# Patient Record
Sex: Female | Born: 1990 | Race: White | Hispanic: No | Marital: Married | Smoking: Never smoker
Health system: Southern US, Community
[De-identification: ages and names within clinical notes are randomized; demographics above are authoritative.]

## PROBLEM LIST (undated history)

## (undated) DIAGNOSIS — G35 Multiple sclerosis: Secondary | ICD-10-CM

## (undated) DIAGNOSIS — F32A Depression, unspecified: Secondary | ICD-10-CM

## (undated) DIAGNOSIS — D649 Anemia, unspecified: Secondary | ICD-10-CM

## (undated) DIAGNOSIS — F329 Major depressive disorder, single episode, unspecified: Secondary | ICD-10-CM

## (undated) HISTORY — PX: WRIST SURGERY: SHX841

## (undated) HISTORY — PX: LAPAROSCOPIC ENDOMETRIOSIS FULGURATION: SUR769

---

## 2013-04-28 DIAGNOSIS — F331 Major depressive disorder, recurrent, moderate: Secondary | ICD-10-CM | POA: Insufficient documentation

## 2017-04-23 ENCOUNTER — Ambulatory Visit: Payer: Self-pay | Admitting: Medical

## 2017-04-23 DIAGNOSIS — Z021 Encounter for pre-employment examination: Secondary | ICD-10-CM

## 2017-04-23 NOTE — Patient Instructions (Signed)
Cholesterol Cholesterol is a fat. Your body needs a small amount of cholesterol. Cholesterol (plaque) may build up in your blood vessels (arteries). That makes you more likely to have a heart attack or stroke. You cannot feel your cholesterol level. Having a blood test is the only way to find out if your level is high. Keep your test results. Work with your doctor to keep your cholesterol at a good level. What do the results mean?  Total cholesterol is how much cholesterol is in your blood.  LDL is bad cholesterol. This is the type that can build up. Try to have low LDL.  HDL is good cholesterol. It cleans your blood vessels and carries LDL away. Try to have high HDL.  Triglycerides are fat that the body can store or burn for energy. What are good levels of cholesterol?  Total cholesterol below 200.  LDL below 100 is good for people who have health risks. LDL below 70 is good for people who have very high risks.  HDL above 40 is good. It is best to have HDL of 60 or higher.  Triglycerides below 150. How can I lower my cholesterol? Diet Follow your diet program as told by your doctor.  Choose fish, white meat chicken, or turkey that is roasted or baked. Try not to eat red meat, fried foods, sausage, or lunch meats.  Eat lots of fresh fruits and vegetables.  Choose whole grains, beans, pasta, potatoes, and cereals.  Choose olive oil, corn oil, or canola oil. Only use small amounts.  Try not to eat butter, mayonnaise, shortening, or palm kernel oils.  Try not to eat foods with trans fats.  Choose low-fat or nonfat dairy foods. ? Drink skim or nonfat milk. ? Eat low-fat or nonfat yogurt and cheeses. ? Try not to drink whole milk or cream. ? Try not to eat ice cream, egg yolks, or full-fat cheeses.  Healthy desserts include angel food cake, ginger snaps, animal crackers, hard candy, popsicles, and low-fat or nonfat frozen yogurt. Try not to eat pastries, cakes, pies, and  cookies.  Exercise Follow your exercise program as told by your doctor.  Be more active. Try gardening, walking, and taking the stairs.  Ask your doctor about ways that you can be more active.  Medicine  Take over-the-counter and prescription medicines only as told by your doctor. This information is not intended to replace advice given to you by your health care provider. Make sure you discuss any questions you have with your health care provider. Document Released: 06/20/2008 Document Revised: 10/24/2015 Document Reviewed: 10/04/2015 Elsevier Interactive Patient Education  2018 Elsevier Inc.     Food Choices to Lower Your Triglycerides Triglycerides are a type of fat in your blood. High levels of triglycerides can increase the risk of heart disease and stroke. If your triglyceride levels are high, the foods you eat and your eating habits are very important. Choosing the right foods can help lower your triglycerides. What general guidelines do I need to follow?  Lose weight if you are overweight.  Limit or avoid alcohol.  Fill one half of your plate with vegetables and green salads.  Limit fruit to two servings a day. Choose fruit instead of juice.  Make one fourth of your plate whole grains. Look for the word "whole" as the first word in the ingredient list.  Fill one fourth of your plate with lean protein foods.  Enjoy fatty fish (such as salmon, mackerel, sardines, and tuna) three   times a week.  Choose healthy fats.  Limit foods high in starch and sugar.  Eat more home-cooked food and less restaurant, buffet, and fast food.  Limit fried foods.  Cook foods using methods other than frying.  Limit saturated fats.  Check ingredient lists to avoid foods with partially hydrogenated oils (trans fats) in them. What foods can I eat? Grains Whole grains, such as whole wheat or whole grain breads, crackers, cereals, and pasta. Unsweetened oatmeal, bulgur, barley, quinoa,  or brown rice. Corn or whole wheat flour tortillas. Vegetables Fresh or frozen vegetables (raw, steamed, roasted, or grilled). Green salads. Fruits All fresh, canned (in natural juice), or frozen fruits. Meat and Other Protein Products Ground beef (85% or leaner), grass-fed beef, or beef trimmed of fat. Skinless chicken or turkey. Ground chicken or turkey. Pork trimmed of fat. All fish and seafood. Eggs. Dried beans, peas, or lentils. Unsalted nuts or seeds. Unsalted canned or dry beans. Dairy Low-fat dairy products, such as skim or 1% milk, 2% or reduced-fat cheeses, low-fat ricotta or cottage cheese, or plain low-fat yogurt. Fats and Oils Tub margarines without trans fats. Light or reduced-fat mayonnaise and salad dressings. Avocado. Safflower, olive, or canola oils. Natural peanut or almond butter. The items listed above may not be a complete list of recommended foods or beverages. Contact your dietitian for more options. What foods are not recommended? Grains White bread. White pasta. White rice. Cornbread. Bagels, pastries, and croissants. Crackers that contain trans fat. Vegetables White potatoes. Corn. Creamed or fried vegetables. Vegetables in a cheese sauce. Fruits Dried fruits. Canned fruit in light or heavy syrup. Fruit juice. Meat and Other Protein Products Fatty cuts of meat. Ribs, chicken wings, bacon, sausage, bologna, salami, chitterlings, fatback, hot dogs, bratwurst, and packaged luncheon meats. Dairy Whole or 2% milk, cream, half-and-half, and cream cheese. Whole-fat or sweetened yogurt. Full-fat cheeses. Nondairy creamers and whipped toppings. Processed cheese, cheese spreads, or cheese curds. Sweets and Desserts Corn syrup, sugars, honey, and molasses. Candy. Jam and jelly. Syrup. Sweetened cereals. Cookies, pies, cakes, donuts, muffins, and ice cream. Fats and Oils Butter, stick margarine, lard, shortening, ghee, or bacon fat. Coconut, palm kernel, or palm  oils. Beverages Alcohol. Sweetened drinks (such as sodas, lemonade, and fruit drinks or punches). The items listed above may not be a complete list of foods and beverages to avoid. Contact your dietitian for more information. This information is not intended to replace advice given to you by your health care provider. Make sure you discuss any questions you have with your health care provider. Document Released: 01/10/2004 Document Revised: 08/30/2015 Document Reviewed: 01/26/2013 Elsevier Interactive Patient Education  2017 Elsevier Inc.   

## 2017-04-23 NOTE — Progress Notes (Signed)
   Subjective:    Patient ID: Kelly David, female    DOB: 1990/07/06, 27 y.o.   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  She has a history of Depression and Multiple Sclerosis.   108/96 HR 70  Review of Systems  Constitutional: Negative for chills and fever.  HENT: Negative for congestion, ear pain and sore throat.   Eyes: Negative for discharge and itching.  Respiratory: Negative for cough and shortness of breath.   Cardiovascular: Negative for chest pain.  Gastrointestinal: Negative for abdominal pain, diarrhea, nausea and vomiting.  Endocrine: Negative for polydipsia, polyphagia and polyuria.  Genitourinary: Negative for dysuria.  Musculoskeletal: Negative for myalgias.  Skin: Negative for rash.  Neurological: Negative for dizziness, syncope and light-headedness.  Hematological: Negative for adenopathy.  Psychiatric/Behavioral: Negative for behavioral problems, self-injury and suicidal ideas. The patient is not nervous/anxious.        Objective:   Physical Exam  Constitutional: She is oriented to person, place, and time. She appears well-developed and well-nourished.  HENT:  Head: Normocephalic and atraumatic.  Right Ear: External ear normal.  Left Ear: External ear normal.  Nose: Nose normal.  Mouth/Throat: Oropharynx is clear and moist.  Eyes: Conjunctivae and EOM are normal. Pupils are equal, round, and reactive to light. No scleral icterus.  Fundoscopic exam:      The right eye shows red reflex.       The left eye shows red reflex.  Neck: Trachea normal and normal range of motion. Neck supple.  Cardiovascular: Normal rate, regular rhythm and normal heart sounds. Exam reveals no gallop and no friction rub.  No murmur heard. Pulmonary/Chest: Effort normal and breath sounds normal.  Abdominal: Soft. Bowel sounds are normal.  Lymphadenopathy:    She has no cervical adenopathy.  Neurological: She is alert  and oriented to person, place, and time. She has normal strength. No cranial nerve deficit or sensory deficit. She displays a negative Romberg sign. GCS eye subscore is 4. GCS verbal subscore is 5. GCS motor subscore is 6.  Reflex Scores:      Patellar reflexes are 2+ on the right side and 2+ on the left side. Skin: Skin is warm and dry.  Psychiatric: She has a normal mood and affect. Her speech is normal and behavior is normal. Judgment and thought content normal. Cognition and memory are normal.  Nursing note and vitals reviewed.   Able to flex/extend trunk and rotate without difficulty. No pain with palpation of spine.     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 Multiple Sclerosis clearance patient just seen by her doctor in [place] and going to get clearance from him. Hearing (ENT) clearance needed abnormal per our hearing test. Fasting lab recheck  54 on lab specimen in contact with other blood cells will repeat. Patient verbalizes understanding of plan and has no questions at discharge.

## 2018-01-22 ENCOUNTER — Other Ambulatory Visit: Payer: Self-pay | Admitting: Obstetrics and Gynecology

## 2018-01-22 DIAGNOSIS — Z369 Encounter for antenatal screening, unspecified: Secondary | ICD-10-CM

## 2018-01-22 LAB — OB RESULTS CONSOLE HEPATITIS B SURFACE ANTIGEN: Hepatitis B Surface Ag: NEGATIVE

## 2018-01-22 LAB — OB RESULTS CONSOLE RUBELLA ANTIBODY, IGM: Rubella: IMMUNE

## 2018-01-22 LAB — OB RESULTS CONSOLE VARICELLA ZOSTER ANTIBODY, IGG: Varicella: IMMUNE

## 2018-02-08 ENCOUNTER — Telehealth: Payer: Self-pay | Admitting: Obstetrics and Gynecology

## 2018-02-08 ENCOUNTER — Ambulatory Visit: Payer: BLUE CROSS/BLUE SHIELD

## 2018-02-08 ENCOUNTER — Ambulatory Visit: Payer: BLUE CROSS/BLUE SHIELD | Attending: Obstetrics and Gynecology

## 2018-02-08 NOTE — Telephone Encounter (Signed)
We contacted Ms. Kelly David because she did not show for her first trimester screening appointment today.  She stated that she was not aware of this visit, but that she is in class all day every day and cannot come until after 02/25/18.  However, she was referred for genetic counseling and first trimester screening, which must be completed prior to 13 weeks, 6 days gestation.  Due to her schedule, she declined this testing.  I reviewed that routine screening for chromosome conditions and spina bifida can be performed at her OB in the second trimester if desired at one of her usual OB visits.  She may contact our office back if she changes her mind and would like to reschedule in the next week.  Cherly Anderson, MS, CGC

## 2018-04-07 NOTE — L&D Delivery Note (Signed)
Date of delivery:08/10/2018 Estimated Date of Delivery: 08/17/18 Patient's last menstrual period was 11/10/2017. EGA: [redacted]w[redacted]d  Delivery Note At 6:46 PM a viable female was delivered via Vaginal, Spontaneous (Presentation: cephalic; direct OA).  APGAR: 8, 9; weight pending skin to skin .   Placenta status: spontaneous, intact.  Cord: 3vv,  with the following complications: none apparent.  Cord pH: not collected  Anesthesia:  epidural Episiotomy: None Lacerations: shallow 1st degree Suture Repair: 3.0 vicryl Est. Blood Loss (mL):  950cc  Mom presented to L&D with elective IOL, given cytotec, AROM and  augmented with pitocin. epidual placed. Progressed to complete, second stage: 2 contractions, delivery of fetal head with restitution to LOT.   Anterior then posterior shoulders delivered without difficulty.  Baby placed on mom's chest, and attended to by peds.  Cord was then clamped and cut by FOB after >60sec delay.  Placenta spontaneously delivered, intact.   IV pitocin given for hemorrhage prophylaxis with fundal massage.  We sang happy birthday to baby Geno.   Mom to postpartum.  Baby to Couplet care / Skin to Skin.  Dosha Broshears C Cam Dauphin 08/10/2018, 7:14 PM

## 2018-07-20 LAB — OB RESULTS CONSOLE RPR: RPR: NONREACTIVE

## 2018-07-20 LAB — OB RESULTS CONSOLE HIV ANTIBODY (ROUTINE TESTING): HIV: NONREACTIVE

## 2018-07-20 LAB — OB RESULTS CONSOLE GC/CHLAMYDIA
Chlamydia: NEGATIVE
Gonorrhea: NEGATIVE

## 2018-07-20 LAB — OB RESULTS CONSOLE GBS: GBS: NEGATIVE

## 2018-08-03 ENCOUNTER — Other Ambulatory Visit: Payer: Self-pay | Admitting: Certified Nurse Midwife

## 2018-08-03 NOTE — Progress Notes (Signed)
Kelly David is a 29 y.o. G84P1001 female, dated by LMP c/w [redacted]w[redacted]d u/s on 9/25/219.   Pregnancy Issues: 1. Multiple sclerosis 2. Depression, currently on Zoloft 150mg  daily 3. History of attempted overdose 07/2011 4. Prepregnancy BMI 33.7 5. Presyncope with dyspnea and dizziness, cardiac workup WNL  Prenatal care site: [facility]   Prenatal Labs: Blood type/Rh O+  Antibody screen neg  Rubella Immune  Varicella Immune  RPR NR  HBsAg Neg  HIV NR  GC neg  Chlamydia neg  Genetic screening AFP/Tetra negative  1 hour GTT 67  3 hour GTT n/a  GBS negative

## 2018-08-09 ENCOUNTER — Other Ambulatory Visit: Payer: Self-pay | Admitting: Obstetrics & Gynecology

## 2018-08-10 ENCOUNTER — Inpatient Hospital Stay: Payer: BLUE CROSS/BLUE SHIELD | Admitting: Anesthesiology

## 2018-08-10 ENCOUNTER — Encounter: Payer: Self-pay | Admitting: *Deleted

## 2018-08-10 ENCOUNTER — Other Ambulatory Visit: Payer: Self-pay

## 2018-08-10 ENCOUNTER — Inpatient Hospital Stay
Admission: EM | Admit: 2018-08-10 | Discharge: 2018-08-11 | DRG: 806 | Disposition: A | Discharge status: Home / Self Care | Payer: BLUE CROSS/BLUE SHIELD | Attending: Obstetrics & Gynecology | Admitting: Obstetrics & Gynecology

## 2018-08-10 DIAGNOSIS — O99344 Other mental disorders complicating childbirth: Secondary | ICD-10-CM | POA: Diagnosis present

## 2018-08-10 DIAGNOSIS — O9081 Anemia of the puerperium: Secondary | ICD-10-CM | POA: Diagnosis not present

## 2018-08-10 DIAGNOSIS — O99019 Anemia complicating pregnancy, unspecified trimester: Secondary | ICD-10-CM | POA: Diagnosis present

## 2018-08-10 DIAGNOSIS — D62 Acute posthemorrhagic anemia: Secondary | ICD-10-CM | POA: Diagnosis not present

## 2018-08-10 DIAGNOSIS — G35 Multiple sclerosis: Secondary | ICD-10-CM | POA: Diagnosis present

## 2018-08-10 DIAGNOSIS — F32A Depression, unspecified: Secondary | ICD-10-CM | POA: Diagnosis present

## 2018-08-10 DIAGNOSIS — Z3A39 39 weeks gestation of pregnancy: Secondary | ICD-10-CM | POA: Diagnosis not present

## 2018-08-10 DIAGNOSIS — F329 Major depressive disorder, single episode, unspecified: Secondary | ICD-10-CM | POA: Diagnosis present

## 2018-08-10 DIAGNOSIS — O26893 Other specified pregnancy related conditions, third trimester: Secondary | ICD-10-CM | POA: Diagnosis present

## 2018-08-10 DIAGNOSIS — O99354 Diseases of the nervous system complicating childbirth: Secondary | ICD-10-CM | POA: Diagnosis present

## 2018-08-10 HISTORY — DX: Major depressive disorder, single episode, unspecified: F32.9

## 2018-08-10 HISTORY — DX: Anemia, unspecified: D64.9

## 2018-08-10 HISTORY — DX: Multiple sclerosis: G35

## 2018-08-10 HISTORY — DX: Depression, unspecified: F32.A

## 2018-08-10 LAB — CBC
HCT: 31.5 % — ABNORMAL LOW (ref 36.0–46.0)
Hemoglobin: 9.9 g/dL — ABNORMAL LOW (ref 12.0–15.0)
MCH: 27.7 pg (ref 26.0–34.0)
MCHC: 31.4 g/dL (ref 30.0–36.0)
MCV: 88.2 fL (ref 80.0–100.0)
Platelets: 195 10*3/uL (ref 150–400)
RBC: 3.57 MIL/uL — ABNORMAL LOW (ref 3.87–5.11)
RDW: 17.2 % — ABNORMAL HIGH (ref 11.5–15.5)
WBC: 10.9 10*3/uL — ABNORMAL HIGH (ref 4.0–10.5)
nRBC: 0 % (ref 0.0–0.2)

## 2018-08-10 LAB — TYPE AND SCREEN
ABO/RH(D): O POS
Antibody Screen: NEGATIVE

## 2018-08-10 MED ORDER — DOCUSATE SODIUM 100 MG PO CAPS
100.0000 mg | ORAL_CAPSULE | Freq: Two times a day (BID) | ORAL | Status: DC
  Administered 2018-08-11: 09:00:00 100 mg via ORAL
  Filled 2018-08-10: qty 1

## 2018-08-10 MED ORDER — ONDANSETRON HCL 4 MG/2ML IJ SOLN: 4.0000 mg | Freq: Four times a day (QID) | INTRAMUSCULAR | Status: DC | PRN

## 2018-08-10 MED ORDER — AMMONIA AROMATIC IN INHA
RESPIRATORY_TRACT | Status: AC
  Filled 2018-08-10: qty 10

## 2018-08-10 MED ORDER — PRENATAL MULTIVITAMIN CH
1.0000 | ORAL_TABLET | Freq: Every day | ORAL | Status: DC
  Filled 2018-08-10: qty 1

## 2018-08-10 MED ORDER — BUTORPHANOL TARTRATE 1 MG/ML IJ SOLN: 1.0000 mg | INTRAMUSCULAR | Status: DC | PRN

## 2018-08-10 MED ORDER — LIDOCAINE HCL (PF) 1 % IJ SOLN
30.0000 mL | INTRAMUSCULAR | Status: AC | PRN
  Administered 2018-08-10: 3 mL via SUBCUTANEOUS

## 2018-08-10 MED ORDER — WITCH HAZEL-GLYCERIN EX PADS: 1.0000 "application " | MEDICATED_PAD | CUTANEOUS | Status: DC

## 2018-08-10 MED ORDER — LIDOCAINE HCL (PF) 1 % IJ SOLN
INTRAMUSCULAR | Status: AC
  Filled 2018-08-10: qty 30

## 2018-08-10 MED ORDER — COCONUT OIL OIL: 1.0000 "application " | TOPICAL_OIL | Status: DC | PRN

## 2018-08-10 MED ORDER — OXYTOCIN BOLUS FROM INFUSION
500.0000 mL | Freq: Once | INTRAVENOUS | Status: AC
  Administered 2018-08-10: 500 mL via INTRAVENOUS

## 2018-08-10 MED ORDER — NIFEDIPINE 10 MG PO CAPS
ORAL_CAPSULE | ORAL | Status: AC
  Filled 2018-08-10: qty 2

## 2018-08-10 MED ORDER — ONDANSETRON HCL 4 MG/2ML IJ SOLN: 4.0000 mg | INTRAMUSCULAR | Status: DC | PRN

## 2018-08-10 MED ORDER — HYDROCORTISONE (PERIANAL) 2.5 % EX CREA
TOPICAL_CREAM | CUTANEOUS | Status: DC
  Filled 2018-08-10: qty 28.35

## 2018-08-10 MED ORDER — OXYTOCIN 10 UNIT/ML IJ SOLN
INTRAMUSCULAR | Status: AC
  Filled 2018-08-10: qty 2

## 2018-08-10 MED ORDER — MISOPROSTOL 100 MCG PO TABS
25.0000 ug | ORAL_TABLET | ORAL | Status: DC | PRN
  Administered 2018-08-10: 02:00:00 25 ug via VAGINAL
  Filled 2018-08-10 (×2): qty 1

## 2018-08-10 MED ORDER — MISOPROSTOL 200 MCG PO TABS
ORAL_TABLET | ORAL | Status: AC
  Filled 2018-08-10: qty 4

## 2018-08-10 MED ORDER — FENTANYL 2.5 MCG/ML W/ROPIVACAINE 0.15% IN NS 100 ML EPIDURAL (ARMC)
12.0000 mL/h | EPIDURAL | Status: DC
  Administered 2018-08-10 (×2): 12 mL/h via EPIDURAL

## 2018-08-10 MED ORDER — FERROUS SULFATE 325 (65 FE) MG PO TABS
325.0000 mg | ORAL_TABLET | Freq: Three times a day (TID) | ORAL | Status: DC
  Filled 2018-08-10: qty 1

## 2018-08-10 MED ORDER — PHENYLEPHRINE 40 MCG/ML (10ML) SYRINGE FOR IV PUSH (FOR BLOOD PRESSURE SUPPORT)
80.0000 ug | PREFILLED_SYRINGE | INTRAVENOUS | Status: DC | PRN
  Filled 2018-08-10: qty 10

## 2018-08-10 MED ORDER — OXYTOCIN 40 UNITS IN NORMAL SALINE INFUSION - SIMPLE MED
1.0000 m[IU]/min | INTRAVENOUS | Status: DC
  Administered 2018-08-10: 08:00:00 2 m[IU]/min via INTRAVENOUS
  Filled 2018-08-10: qty 1000

## 2018-08-10 MED ORDER — FENTANYL 2.5 MCG/ML W/ROPIVACAINE 0.15% IN NS 100 ML EPIDURAL (ARMC)
EPIDURAL | Status: AC
  Filled 2018-08-10: qty 100

## 2018-08-10 MED ORDER — IBUPROFEN 600 MG PO TABS
600.0000 mg | ORAL_TABLET | Freq: Four times a day (QID) | ORAL | Status: DC
  Administered 2018-08-10 – 2018-08-11 (×4): 600 mg via ORAL
  Filled 2018-08-10 (×4): qty 1

## 2018-08-10 MED ORDER — DIPHENHYDRAMINE HCL 25 MG PO CAPS: 25.0000 mg | ORAL_CAPSULE | Freq: Four times a day (QID) | ORAL | Status: DC | PRN

## 2018-08-10 MED ORDER — MISOPROSTOL 100 MCG PO TABS
25.0000 ug | ORAL_TABLET | ORAL | Status: DC | PRN
  Administered 2018-08-10: 02:00:00 25 ug via BUCCAL
  Filled 2018-08-10 (×2): qty 1

## 2018-08-10 MED ORDER — BENZOCAINE-MENTHOL 20-0.5 % EX AERO
1.0000 "application " | INHALATION_SPRAY | CUTANEOUS | Status: DC | PRN
  Administered 2018-08-11: 1 via TOPICAL
  Filled 2018-08-10: qty 56

## 2018-08-10 MED ORDER — ONDANSETRON HCL 4 MG PO TABS: 4.0000 mg | ORAL_TABLET | ORAL | Status: DC | PRN

## 2018-08-10 MED ORDER — OXYTOCIN 40 UNITS IN NORMAL SALINE INFUSION - SIMPLE MED
2.5000 [IU]/h | INTRAVENOUS | Status: DC
  Filled 2018-08-10: qty 1000

## 2018-08-10 MED ORDER — SOD CITRATE-CITRIC ACID 500-334 MG/5ML PO SOLN: 30.0000 mL | ORAL | Status: DC | PRN

## 2018-08-10 MED ORDER — EPHEDRINE 5 MG/ML INJ
10.0000 mg | INTRAVENOUS | Status: DC | PRN
  Filled 2018-08-10: qty 2

## 2018-08-10 MED ORDER — LIDOCAINE-EPINEPHRINE (PF) 1.5 %-1:200000 IJ SOLN
INTRAMUSCULAR | Status: DC | PRN
  Administered 2018-08-10: 3 mL via EPIDURAL

## 2018-08-10 MED ORDER — LACTATED RINGERS IV SOLN: 500.0000 mL | INTRAVENOUS | Status: DC | PRN

## 2018-08-10 MED ORDER — LACTATED RINGERS IV SOLN
500.0000 mL | Freq: Once | INTRAVENOUS | Status: AC
  Administered 2018-08-10: 14:00:00 500 mL via INTRAVENOUS

## 2018-08-10 MED ORDER — LACTATED RINGERS IV SOLN
INTRAVENOUS | Status: DC
  Administered 2018-08-10 (×2): via INTRAVENOUS

## 2018-08-10 MED ORDER — ACETAMINOPHEN 325 MG PO TABS: 650.0000 mg | ORAL_TABLET | ORAL | Status: DC | PRN

## 2018-08-10 MED ORDER — SIMETHICONE 80 MG PO CHEW: 80.0000 mg | CHEWABLE_TABLET | ORAL | Status: DC | PRN

## 2018-08-10 MED ORDER — SERTRALINE HCL 25 MG PO TABS
150.0000 mg | ORAL_TABLET | Freq: Every day | ORAL | Status: DC
  Administered 2018-08-11: 150 mg via ORAL
  Filled 2018-08-10 (×2): qty 1

## 2018-08-10 MED ORDER — DIPHENHYDRAMINE HCL 50 MG/ML IJ SOLN: 12.5000 mg | INTRAMUSCULAR | Status: DC | PRN

## 2018-08-10 MED ORDER — TERBUTALINE SULFATE 1 MG/ML IJ SOLN: 0.2500 mg | Freq: Once | INTRAMUSCULAR | Status: DC | PRN

## 2018-08-10 MED ORDER — NIFEDIPINE 10 MG PO CAPS
ORAL_CAPSULE | ORAL | Status: AC
  Filled 2018-08-10: qty 1

## 2018-08-10 MED ORDER — ACETAMINOPHEN 500 MG PO TABS
1000.0000 mg | ORAL_TABLET | Freq: Four times a day (QID) | ORAL | Status: DC | PRN
  Administered 2018-08-11 (×2): 1000 mg via ORAL
  Filled 2018-08-10 (×2): qty 2

## 2018-08-10 MED ORDER — BUPIVACAINE HCL (PF) 0.25 % IJ SOLN
INTRAMUSCULAR | Status: DC | PRN
  Administered 2018-08-10: 3 mL via EPIDURAL
  Administered 2018-08-10: 4 mL via EPIDURAL

## 2018-08-10 NOTE — Anesthesia Procedure Notes (Signed)
Epidural Patient location during procedure: OB Start time: 08/10/2018 2:10 PM End time: 08/10/2018 2:13 PM  Staffing Anesthesiologist: Piscitello, Cleda Mccreedy, MD Resident/CRNA: [name], CRNA Performed: resident/CRNA   Preanesthetic Checklist Completed: patient identified, site marked, surgical consent, pre-op evaluation, timeout performed, IV checked, risks and benefits discussed and monitors and equipment checked  Epidural Patient position: sitting Prep: ChloraPrep Patient monitoring: heart rate, continuous pulse ox and blood pressure Approach: midline Location: L3-L4 Injection technique: LOR saline  Needle:  Needle type: Tuohy  Needle gauge: 18 G Needle length: 9 cm and 9 Needle insertion depth: 6 cm Catheter type: closed end flexible Catheter size: 19 Gauge Catheter at skin depth: 11 cm Test dose: negative and 1.5% lidocaine with Epi 1:200 K  Assessment Sensory level: T10 Events: blood not aspirated, injection not painful, no injection resistance, negative IV test and no paresthesia  Additional Notes 1st attempt Pt. Evaluated and documentation done after procedure finished. Patient identified. Risks/Benefits/Options discussed with patient including but not limited to bleeding, infection, nerve damage, paralysis, failed block, incomplete pain control, headache, blood pressure changes, nausea, vomiting, reactions to medication both or allergic, itching and postpartum back pain. Confirmed with bedside nurse the patient's most recent platelet count. Confirmed with patient that they are not currently taking any anticoagulation, have any bleeding history or any family history of bleeding disorders. Patient expressed understanding and wished to proceed. All questions were answered. Sterile technique was used throughout the entire procedure. Please see nursing notes for vital signs. Test dose was given through epidural catheter and negative prior to continuing to dose epidural or  start infusion. Warning signs of high block given to the patient including shortness of breath, tingling/numbness in hands, complete motor block, or any concerning symptoms with instructions to call for help. Patient was given instructions on fall risk and not to get out of bed. All questions and concerns addressed with instructions to call with any issues or inadequate analgesia.   Patient tolerated the insertion well without immediate complications.Reason for block:procedure for pain

## 2018-08-10 NOTE — Progress Notes (Signed)
Labor Progress Note  Kelly David is a 28 y.o. G2P1001 at [redacted]w[redacted]d by LMP c/w [redacted]w[redacted]d u/s on 12/30/17  admitted for induction of labor due to Elective at term.  Subjective: feeling more discomfort with UCs now, has done multiple position changes and ambulated.   Objective: BP 108/69   Pulse 82   Temp 98.1 F (36.7 C) (Oral)   Resp 18  Notable VS details: reviewed.   Fetal Assessment: FHT:  FHR: 135 bpm, variability: moderate,  accelerations:  Present,  decelerations:  Absent Category/reactivity:  Category I UC:   regular, every 1-3 minutes; Pitocin at 76mu/min SVE:   3/50/-1, soft/midposition, cervix remains slightly to pt right, fetal vertex well applied. AROM performed, small amt clear fluid, bloody show noted.  Membrane status: AROM at 1310 Amniotic color: clear  Labs: Lab Results  Component Value Date   WBC 10.9 (H) 08/10/2018   HGB 9.9 (L) 08/10/2018   HCT 31.5 (L) 08/10/2018   MCV 88.2 08/10/2018   PLT 195 08/10/2018    Assessment / Plan: Induction of labor due to term with favorable cervix,  progressing well on pitocin  Labor: Progressing on Pitocin, AROM performed. Monitor closely.  Preeclampsia:  normotensive Fetal Wellbeing:  Category I Pain Control:  Labor support without medications, desires epidural at some point.  I/D:  n/a Anticipated MOD:  NSVD  Randa Ngo, CNM 08/10/2018, 1:29 PM

## 2018-08-10 NOTE — Progress Notes (Signed)
Labor Progress Note  Kelly David is a 28 y.o. G2P1001 at [redacted]w[redacted]d by LMP admitted for IOL.   Subjective: comfortable after epidural  Objective: BP 119/76   Pulse 66   Temp 98.1 F (36.7 C) (Oral)   Resp 18   SpO2 (!) 86%  Notable VS details: reviewed  Fetal Assessment: FHT:  FHR: 135 bpm, variability: moderate,  accelerations:  Present,  decelerations:  Absent Category/reactivity:  Category I UC:   regular, every 2-5 minutes; Pitocin at 34mu/min SVE:   5-6/75/-1, soft/anterior Membrane status: AROM 1310 Amniotic color: clear, moderate amt bloody show  Labs: Lab Results  Component Value Date   WBC 10.9 (H) 08/10/2018   HGB 9.9 (L) 08/10/2018   HCT 31.5 (L) 08/10/2018   MCV 88.2 08/10/2018   PLT 195 08/10/2018    Assessment / Plan: active labor  Labor: Progressing normally Preeclampsia:  no e/o Pre-e Fetal Wellbeing:  Category I Pain Control:  Epidural I/D:  n/a Anticipated MOD:  NSVD  Prudencio Pair McVey, CNM 08/10/2018, 3:53 PM

## 2018-08-10 NOTE — H&P (Signed)
OB History & Physical   History of Present Illness:  Chief Complaint: scheduled IOL  HPI:  Kelly David is a 28 y.o. G31P1001 female at [redacted]w[redacted]d dated by LMP c/w [redacted]w[redacted]d u/s on 9/25/219. She presents to L&D for scheduled term induction. On arrival has had cytotec induction for 2cm posterior cervix, Pitocin was started this am. Pt reports active FM, mild UCs 3/10, lower abdominal cramping. Denies VB or LOF.    Pregnancy Issues: 1. Multiple sclerosis 2. Depression, currently on Zoloft 150mg  daily 3. History of attempted overdose 07/2011 4. Prepregnancy BMI 33.7 5. Presyncope with dyspnea and dizziness, cardiac workup WNL   Maternal Medical History:   Past Medical History:  Diagnosis Date  . Anemia   . Depression   . Multiple sclerosis (HCC)     Past Surgical History:  Procedure Laterality Date  . WRIST SURGERY Bilateral     No Known Allergies  Prior to Admission medications   Medication Sig Start Date End Date Taking? Authorizing Provider  ferrous sulfate 325 (65 FE) MG EC tablet Take 325 mg by mouth 3 (three) times daily with meals.   Yes [provider]  prenatal vitamin w/FE, FA (PRENATAL 1 + 1) 27-1 MG TABS tablet Take 1 tablet by mouth daily at 12 noon.   Yes [provider]  sertraline (ZOLOFT) 100 MG tablet Take 150 mg by mouth daily.   Yes [provider]     Prenatal care site: [facility]    Social History: She  reports that she has never smoked. She has never used smokeless tobacco. She reports that she does not drink alcohol or use drugs.  Family History: family history includes Multiple sclerosis in her father.   Review of Systems: A full review of systems was performed and negative except as noted in the HPI.     Physical Exam:  Vital Signs: BP 123/70   Pulse 69   Temp 98 F (36.7 C) (Oral)   Resp 18  General: no acute distress.  HEENT: normocephalic, atraumatic Heart: regular rate & rhythm.  No  murmurs/rubs/gallops Lungs: clear to auscultation bilaterally, normal respiratory effort Abdomen: soft, gravid, non-tender;  EFW: 7.5lbs Pelvic:   External: Normal external female genitalia  Cervix: Dilation: 2 / Effacement (%): 70 / Station: -3 Cervix soft, midline and displaced to pt right.    Extremities: non-tender, symmetric, no edema bilaterally.  DTRs: 2+  Neurologic: Alert & oriented x 3.    Results for orders placed or performed during the hospital encounter of 08/10/18 (from the past 24 hour(s))  CBC     Status: Abnormal   Collection Time: 08/10/18  1:04 AM  Result Value Ref Range   WBC 10.9 (H) 4.0 - 10.5 K/uL   RBC 3.57 (L) 3.87 - 5.11 MIL/uL   Hemoglobin 9.9 (L) 12.0 - 15.0 g/dL   HCT 08.6 (L) 57.8 - 46.9 %   MCV 88.2 80.0 - 100.0 fL   MCH 27.7 26.0 - 34.0 pg   MCHC 31.4 30.0 - 36.0 g/dL   RDW 62.9 (H) 52.8 - 41.3 %   Platelets 195 150 - 400 K/uL   nRBC 0.0 0.0 - 0.2 %  Type and screen     Status: None   Collection Time: 08/10/18  1:04 AM  Result Value Ref Range   ABO/RH(D) O POS    Antibody Screen NEG    Sample Expiration      08/13/2018 Performed at [facility], [address] [place], [place] [address]     Pertinent Results:  Prenatal Labs: Blood type/Rh  O pos  Antibody screen neg  Rubella Immune  Varicella Immune  RPR NR  HBsAg Neg  HIV NR  GC neg  Chlamydia neg  Genetic screening  AFP/Tetra negative  1 hour GTT  67  GBS  negative   FHT: 135bpm, mod variability, + accels, no decels TOCO: q1-19min, palp mild, lasting 40-70sec SVE:  Dilation: 2 / Effacement (%): 70 / Station: -3    Cephalic by leopolds and SVE  No results found.  Assessment:  Kelly David is a 28 y.o. G91P1001 female at [redacted]w[redacted]d with IOL at term.   Plan:  1. Admit to Labor & Delivery; consents reviewed and obtained  2. Fetal Well being  - Fetal Tracing: Cat I tracing - Group B Streptococcus ppx indicated: negative - Presentation: cephalic confirmed by exam  and Leopolds   3. Routine OB: - Prenatal labs reviewed, as above - Rh O Pos - CBC, T&S, RPR on admit - Clear fluids, IVF  4. Induction of Labor -  Contractions: external toco in place -  Pelvis proven  -  Plan for induction with cyottec, then pitocin -  Plan for continuous fetal monitoring  -  Maternal pain control as desired; requesting IVPM, nitrous, regional anesthesia - Anticipate vaginal delivery  5. Post Partum Planning: - Infant feeding: breast - Contraception: undecided  Randa Ngo, CNM 08/10/18 11:04 AM

## 2018-08-10 NOTE — Anesthesia Preprocedure Evaluation (Addendum)
Anesthesia Evaluation  Patient identified by MRN, date of birth, ID band Patient awake    Reviewed: Allergy & Precautions, NPO status , Patient's Chart, lab work & pertinent test results, reviewed documented beta blocker date and time   Airway Mallampati: II  TM Distance: >3 FB Neck ROM: Full    Dental  (+) Dental Advisory Given   Pulmonary           Cardiovascular      Neuro/Psych PSYCHIATRIC DISORDERS Depression    GI/Hepatic   Endo/Other    Renal/GU      Musculoskeletal   Abdominal   Peds  Hematology  (+) Blood dyscrasia, anemia ,   Anesthesia Other Findings Patient has history of MS, pt states she has had dizziness during this pregnancy but no other motor or sensory symptoms.  No issues with previous epidural.  Reproductive/Obstetrics (+) Pregnancy                           Anesthesia Physical Anesthesia Plan  ASA: III  Anesthesia Plan: Epidural   Post-op Pain Management:    Induction:   PONV Risk Score and Plan:   Airway Management Planned:   Additional Equipment:   Intra-op Plan:   Post-operative Plan:   Informed Consent:   Plan Discussed with: Anesthesiologist and CRNA  Anesthesia Plan Comments: (Patient consented for risk of MS flare from epidural and voiced understanding.)      Anesthesia Quick Evaluation

## 2018-08-10 NOTE — Plan of Care (Signed)

## 2018-08-10 NOTE — Discharge Summary (Signed)
Obstetrical Discharge Summary  Patient Name: Kelly David DOB: 06-27-1990 MRN: [id number]  Date of Admission: 08/10/2018 Date of Delivery:08/10/2018 Delivered by: Ranae Plumber, MD Date of Discharge: 08/11/2018  Primary OB: [facility]  NTI:[id number] last menstrual period was 11/10/2017. EDC Estimated Date of Delivery: 08/17/18 Gestational Age at Delivery: [redacted]w[redacted]d   Antepartum complications:  1.Multiple sclerosis 2. Depression, currently on Zoloft 150mg  daily 3. History of attempted overdose 07/2011 4. Prepregnancy BMI 33.7 5.Presyncopewith dyspnea and dizziness, cardiac workup WNL 6. Anemia   Admitting Diagnosis: elective induction of labor at term Secondary Diagnosis: Patient Active Problem List   Diagnosis Date Noted  . Labor and delivery indication for care or intervention 08/10/2018  . Depression 08/10/2018  . Multiple sclerosis (HCC) 08/10/2018  . Anemia affecting pregnancy 08/10/2018    Augmentation: AROM, Pitocin and Cytotec Complications: None Intrapartum complications/course: Mom presented to L&D with elective IOL, given cytotec, AROM and  augmented with pitocin. epidual placed. Progressed to complete, second stage: 2 contractions, delivery of fetal head with restitution to LOT.   Anterior then posterior shoulders delivered without difficulty.  Baby placed on mom's chest, and attended to by peds.  Cord was then clamped and cut by FOB after >60sec delay.  Placenta spontaneously delivered, intact.   IV pitocin given for hemorrhage prophylaxis with fundal massage.  EBL 950cc(measured). Date of Delivery: 08/10/2018 Delivered By: Leeroy Bock Ward Delivery Type: spontaneous vaginal delivery Anesthesia: epidural Placenta: spontaneous Laceration:  Shallow 1st degree vaginal Episiotomy: none Newborn Data: Live born female "Geno" Birth Weight:  3850g 8lb 7.8oz APGAR: 8, 9  Newborn Delivery   Birth date/time:  08/10/2018 18:46:00 Delivery type:  Vaginal, Spontaneous       Postpartum Procedures: none  Post partum course:  Patient had an uncomplicated postpartum course.  By time of discharge on PPD#1, her pain was controlled on oral pain medications; she had appropriate lochia and was ambulating, voiding without difficulty and tolerating regular diet.  She was deemed stable for discharge to home.     Discharge Physical Exam:  BP 103/66 (BP Location: Right Arm)   Pulse 77   Temp 98 F (36.7 C) (Oral)   Resp 18   Ht 5\' 5"  (1.651 m)   LMP 11/10/2017   SpO2 97% Comment: ROOM A1R  Breastfeeding Unknown   General: NAD CV: RRR Pulm: CTABL, nl effort ABD: s/nd/nt, fundus firm and below the umbilicus Lochia: moderate DVT Evaluation: LE non-ttp, no evidence of DVT on exam.  Hemoglobin  Date Value Ref Range Status  08/11/2018 9.0 (L) 12.0 - 15.0 g/dL Final   HCT  Date Value Ref Range Status  08/11/2018 29.0 (L) 36.0 - 46.0 % Final     Disposition: stable, discharge to home. Baby Feeding: formula Baby Disposition: home with mom  Rh Immune globulin given: n/a Rubella vaccine given: n/a Tdap vaccine given in AP or PP setting: AP 05/25/2018 Flu vaccine given in AP or PP setting:  AP declined  Contraception: TBD  Prenatal Labs:  Blood type/Rh  O pos  Antibody screen neg  Rubella Immune  Varicella Immune  RPR NR  HBsAg Neg  HIV NR  GC neg  Chlamydia neg  Genetic screening  AFP/Tetra negative  1 hour GTT  67  GBS  negative     Plan:  [name] was discharged to home in good condition. Follow-up appointment with Dr. Elesa Massed in 6 weeks.  Discharge Medications: Allergies as of 08/11/2018   No Known Allergies     Medication List  TAKE these medications   ferrous sulfate 325 (65 FE) MG EC tablet Take 325 mg by mouth 3 (three) times daily with meals. What changed:  Another medication with the same name was added. Make sure you understand how and when to take each.   ferrous sulfate 325 (65 FE) MG tablet Take 1 tablet (325 mg  total) by mouth daily with breakfast. Take with Vitamin C What changed:  You were already taking a medication with the same name, and this prescription was added. Make sure you understand how and when to take each.   prenatal vitamin w/FE, FA 27-1 MG Tabs tablet Take 1 tablet by mouth daily at 12 noon.   sertraline 100 MG tablet Commonly known as:  ZOLOFT Take 150 mg by mouth daily.       Follow-up Information    Ward, Elenora Fender, MD Follow up in 6 week(s).   Specialty:  Obstetrics and Gynecology Contact information: [address] [place] [address] [phone]           Signed: Christeen Douglas 08/11/18

## 2018-08-11 LAB — RPR: RPR Ser Ql: NONREACTIVE

## 2018-08-11 LAB — CBC
HCT: 29 % — ABNORMAL LOW (ref 36.0–46.0)
Hemoglobin: 9 g/dL — ABNORMAL LOW (ref 12.0–15.0)
MCH: 27.6 pg (ref 26.0–34.0)
MCHC: 31 g/dL (ref 30.0–36.0)
MCV: 89 fL (ref 80.0–100.0)
Platelets: 166 10*3/uL (ref 150–400)
RBC: 3.26 MIL/uL — ABNORMAL LOW (ref 3.87–5.11)
RDW: 17.3 % — ABNORMAL HIGH (ref 11.5–15.5)
WBC: 12.8 10*3/uL — ABNORMAL HIGH (ref 4.0–10.5)
nRBC: 0 % (ref 0.0–0.2)

## 2018-08-11 MED ORDER — FERROUS SULFATE 325 (65 FE) MG PO TABS: 325.0000 mg | ORAL_TABLET | Freq: Every day | ORAL | 1 refills | Status: DC

## 2018-08-11 NOTE — Discharge Instructions (Signed)
Postpartum Care After Vaginal Delivery °This sheet gives you information about how to care for yourself from the time you deliver your baby to up to 6-12 weeks after delivery (postpartum period). Your health care provider may also give you more specific instructions. If you have problems or questions, contact your health care provider. °Follow these instructions at home: °Vaginal bleeding °· It is normal to have vaginal bleeding (lochia) after delivery. Wear a sanitary pad for vaginal bleeding and discharge. °? During the first week after delivery, the amount and appearance of lochia is often similar to a menstrual period. °? Over the next few weeks, it will gradually decrease to a dry, yellow-brown discharge. °? For most women, lochia stops completely by 4-6 weeks after delivery. Vaginal bleeding can vary from woman to woman. °· Change your sanitary pads frequently. Watch for any changes in your flow, such as: °? A sudden increase in volume. °? A change in color. °? Large blood clots. °· If you pass a blood clot from your vagina, save it and call your health care provider to discuss. Do not flush blood clots down the toilet before talking with your health care provider. °· Do not use tampons or douches until your health care provider says this is safe. °· If you are not breastfeeding, your period should return 6-8 weeks after delivery. If you are feeding your child breast milk only (exclusive breastfeeding), your period may not return until you stop breastfeeding. °Perineal care °· Keep the area between the vagina and the anus (perineum) clean and dry as told by your health care provider. Use medicated pads and pain-relieving sprays and creams as directed. °· If you had a cut in the perineum (episiotomy) or a tear in the vagina, check the area for signs of infection until you are healed. Check for: °? More redness, swelling, or pain. °? Fluid or blood coming from the cut or tear. °? Warmth. °? Pus or a bad  smell. °· You may be given a squirt bottle to use instead of wiping to clean the perineum area after you go to the bathroom. As you start healing, you may use the squirt bottle before wiping yourself. Make sure to wipe gently. °· To relieve pain caused by an episiotomy, a tear in the vagina, or swollen veins in the anus (hemorrhoids), try taking a warm sitz bath 2-3 times a day. A sitz bath is a warm water bath that is taken while you are sitting down. The water should only come up to your hips and should cover your buttocks. °Breast care °· Within the first few days after delivery, your breasts may feel heavy, full, and uncomfortable (breast engorgement). Milk may also leak from your breasts. Your health care provider can suggest ways to help relieve the discomfort. Breast engorgement should go away within a few days. °· If you are breastfeeding: °? Wear a bra that supports your breasts and fits you well. °? Keep your nipples clean and dry. Apply creams and ointments as told by your health care provider. °? You may need to use breast pads to absorb milk that leaks from your breasts. °? You may have uterine contractions every time you breastfeed for up to several weeks after delivery. Uterine contractions help your uterus return to its normal size. °? If you have any problems with breastfeeding, work with your health care provider or lactation consultant. °· If you are not breastfeeding: °? Avoid touching your breasts a lot. Doing this can make   your breasts produce more milk. °? Wear a good-fitting bra and use cold packs to help with swelling. °? Do not squeeze out (express) milk. This causes you to make more milk. °Intimacy and sexuality °· Ask your health care provider when you can engage in sexual activity. This may depend on: °? Your risk of infection. °? How fast you are healing. °? Your comfort and desire to engage in sexual activity. °· You are able to get pregnant after delivery, even if you have not had  your period. If desired, talk with your health care provider about methods of birth control (contraception). °Medicines °· Take over-the-counter and prescription medicines only as told by your health care provider. °· If you were prescribed an antibiotic medicine, take it as told by your health care provider. Do not stop taking the antibiotic even if you start to feel better. °Activity °· Gradually return to your normal activities as told by your health care provider. Ask your health care provider what activities are safe for you. °· Rest as much as possible. Try to rest or take a nap while your baby is sleeping. °Eating and drinking ° °· Drink enough fluid to keep your urine pale yellow. °· Eat high-fiber foods every day. These may help prevent or relieve constipation. High-fiber foods include: °? Whole grain cereals and breads. °? Brown rice. °? Beans. °? Fresh fruits and vegetables. °· Do not try to lose weight quickly by cutting back on calories. °· Take your prenatal vitamins until your postpartum checkup or until your health care provider tells you it is okay to stop. °Lifestyle °· Do not use any products that contain nicotine or tobacco, such as cigarettes and e-cigarettes. If you need help quitting, ask your health care provider. °· Do not drink alcohol, especially if you are breastfeeding. °General instructions °· Keep all follow-up visits for you and your baby as told by your health care provider. Most women visit their health care provider for a postpartum checkup within the first 3-6 weeks after delivery. °Contact a health care provider if: °· You feel unable to cope with the changes that your child brings to your life, and these feelings do not go away. °· You feel unusually sad or worried. °· Your breasts become red, painful, or hard. °· You have a fever. °· You have trouble holding urine or keeping urine from leaking. °· You have little or no interest in activities you used to enjoy. °· You have not  breastfed at all and you have not had a menstrual period for 12 weeks after delivery. °· You have stopped breastfeeding and you have not had a menstrual period for 12 weeks after you stopped breastfeeding. °· You have questions about caring for yourself or your baby. °· You pass a blood clot from your vagina. °Get help right away if: °· You have chest pain. °· You have difficulty breathing. °· You have sudden, severe leg pain. °· You have severe pain or cramping in your lower abdomen. °· You bleed from your vagina so much that you fill more than one sanitary pad in one hour. Bleeding should not be heavier than your heaviest period. °· You develop a severe headache. °· You faint. °· You have blurred vision or spots in your vision. °· You have bad-smelling vaginal discharge. °· You have thoughts about hurting yourself or your baby. °If you ever feel like you may hurt yourself or others, or have thoughts about taking your own life, get help   right away. You can go to the nearest emergency department or call:  Your local emergency services (911 in the [place]  A suicide crisis helpline, such as the National Suicide Prevention Lifeline at 779-316-2582. This is open 24 hours a day. Summary  The period of time right after you deliver your newborn up to 6-12 weeks after delivery is called the postpartum period.  Gradually return to your normal activities as told by your health care provider.  Keep all follow-up visits for you and your baby as told by your health care provider. This information is not intended to replace advice given to you by your health care provider. Make sure you discuss any questions you have with your health care provider. Document Released: 01/19/2007 Document Revised: 01/05/2017 Document Reviewed: 01/05/2017 Elsevier Interactive Patient Education  2019 [employer]. Discharge instructions:   Call office if you have any of the following: headache, visual changes, fever >101.0 F,  chills, shortness of breath, breast concerns, excessive vaginal bleeding, incision drainage or problems, leg pain or redness, depression or any other concerns.   Activity: Do not lift > 10 lbs for 6 weeks.  No intercourse or tampons for 6 weeks.  No driving for 1-2 weeks.   Call your doctor for increased pain or vaginal bleeding, temperature above 101.0, depression, or concerns.  No strenuous activity or heavy lifting for 6 weeks.  No intercourse, tampons, douching, or enemas for 6 weeks.  No tub baths-showers only.  No driving for 2 weeks or while taking pain medications.  Continue prenatal vitamin and iron.  Increase calories and fluids while breastfeeding.  You may have a slight fever when your milk comes in, but it should go away on its own.  If it does not, and rises above 101.0 please call the doctor.  For concerns about your baby, please call your pediatrician For breastfeeding concerns, the lactation consultant can be reached at [phone]

## 2018-08-11 NOTE — Progress Notes (Signed)
Post Partum Day 1  Subjective: Doing well, no concerns. Ambulating without difficulty, pain managed with PO meds, tolerating regular diet, and voiding without difficulty.   No fever/chills, chest pain, shortness of breath, nausea/vomiting, or leg pain. No nipple or breast pain.   Objective: BP 112/78 (BP Location: Right Arm)   Pulse 74   Temp 97.7 F (36.5 C) (Oral)   Resp 18   Ht 5\' 5"  (1.651 m)   LMP 11/10/2017   SpO2 97% Comment: ROOM A1R  Breastfeeding Unknown    Physical Exam:  General: alert, cooperative, appears stated age and no distress Breasts: soft/nontender CV: RRR Pulm: nl effort, CTABL Abdomen: soft, non-tender, active bowel sounds Uterine Fundus: firm Lochia: appropriate DVT Evaluation: No evidence of DVT seen on physical exam. No cords or calf tenderness. No significant calf/ankle edema.  Recent Labs    08/10/18 0104 08/11/18 0530  HGB 9.9* 9.0*  HCT 31.5* 29.0*  WBC 10.9* 12.8*  PLT 195 166    Assessment/Plan: 28 y.o. [id number] postpartum day # 1  -Continue routine postpartum care -Encouraged snug fitting bra, cold application, Tylenol PRN, and cabbage leaves for engorgement for formula feeding  -Contraception TBD.  -Acute blood loss anemia - hemodynamically stable and asymptomatic; continue PO ferrous sulfate BID with stool softeners  -Immunization status: all immunizations up to date  Disposition: Desires discharge home today at 24 hours if baby cleared for discharge   LOS: 1 day   Genia Del, CNM 08/11/2018, 8:40 AM   ----- Genia Del Certified Nurse Midwife [facility]

## 2018-08-11 NOTE — Anesthesia Postprocedure Evaluation (Signed)
Anesthesia Post Note  Patient: Kelly David  Procedure(s) Performed: AN AD HOC LABOR EPIDURAL  Patient location during evaluation: Mother Baby Anesthesia Type: Epidural Level of consciousness: awake and alert Pain management: pain level controlled Vital Signs Assessment: post-procedure vital signs reviewed and stable Respiratory status: spontaneous breathing, nonlabored ventilation and respiratory function stable Cardiovascular status: stable Postop Assessment: no headache, no backache, epidural receding and able to ambulate Anesthetic complications: no     Last Vitals:  Vitals:   08/10/18 2253 08/11/18 0335  BP: 120/85 104/81  Pulse: 91 77  Resp: 16 16  Temp: 36.8 C 36.8 C  SpO2: 99% 97%    Last Pain:  Vitals:   08/11/18 0335  TempSrc: Oral  PainSc:                  [name]

## 2018-08-11 NOTE — Progress Notes (Signed)
08/11/2018 9:26 PM  BP 103/66 (BP Location: Right Arm)   Pulse 77   Temp 98 F (36.7 C) (Oral)   Resp 18   Ht 5\' 5"  (165.1 cm)   LMP 11/10/2017   SpO2 97% Comment: ROOM A1R  Breastfeeding Unknown  Patient discharged per MD orders. Discharge instructions reviewed with patient and patient verbalized understanding.  Prescriptions discussed with  patient. Discharged via wheelchair escorted by nursing staff with infant in car seat in lap.  Ron Parker, RN

## 2019-02-14 ENCOUNTER — Other Ambulatory Visit (HOSPITAL_COMMUNITY): Payer: Self-pay | Admitting: Neurology

## 2019-02-14 ENCOUNTER — Other Ambulatory Visit: Payer: Self-pay | Admitting: Neurology

## 2019-02-14 DIAGNOSIS — G35 Multiple sclerosis: Secondary | ICD-10-CM

## 2019-02-25 ENCOUNTER — Other Ambulatory Visit: Payer: Self-pay | Admitting: Podiatry

## 2019-02-25 ENCOUNTER — Ambulatory Visit
Admission: RE | Admit: 2019-02-25 | Discharge: 2019-02-25 | Disposition: A | Discharge status: Home / Self Care | Payer: BC Managed Care – PPO | Source: Ambulatory Visit | Attending: Neurology | Admitting: Neurology

## 2019-02-25 ENCOUNTER — Other Ambulatory Visit: Payer: Self-pay

## 2019-02-25 DIAGNOSIS — G35 Multiple sclerosis: Secondary | ICD-10-CM

## 2019-02-25 DIAGNOSIS — S86011A Strain of right Achilles tendon, initial encounter: Secondary | ICD-10-CM

## 2019-02-25 IMAGING — MR MR HEAD WO/W CM
12 of 15 series · 38 of 48 positions shown · IV contrast (gadavist)
Comparison: None available.

CLINICAL DATA: Follow-up examination for multiple sclerosis. No new
symptoms or complaints.

EXAM:
MRI HEAD WITHOUT AND WITH CONTRAST
MRI CERVICAL SPINE WITHOUT AND WITH CONTRAST
TECHNIQUE: Multiplanar, multiecho pulse sequences of the brain and surrounding
structures, and cervical spine, to include the craniocervical
junction and cervicothoracic junction, were obtained without and
with intravenous contrast.
CONTRAST:  9mL GADAVIST GADOBUTROL 1 MMOL/ML IV SOLN

[Series 9: ax dwi_tracew · axial · 3.0mm · 0.60mm/px · z∈[-104,+44]mm · 3 of 46 slices shown]
[im 1/46]
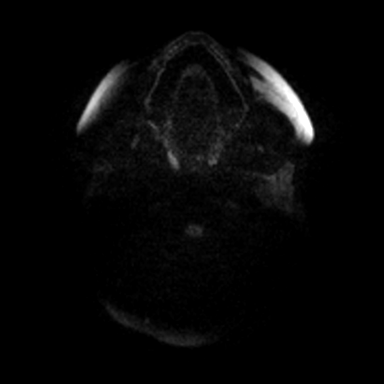
[im 23/46]
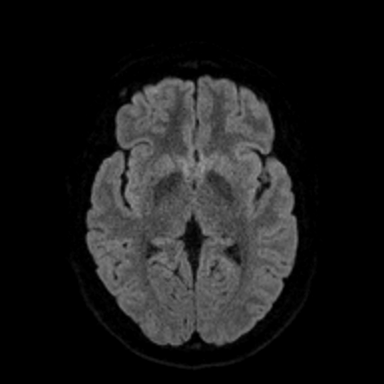
[im 46/46]
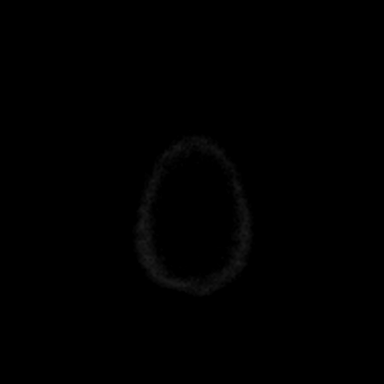

[Series 10: ax dwi_adc · axial · 3.0mm · 0.60mm/px · z∈[-104,+44]mm · 3 of 46 slices shown]
[im 1/46]
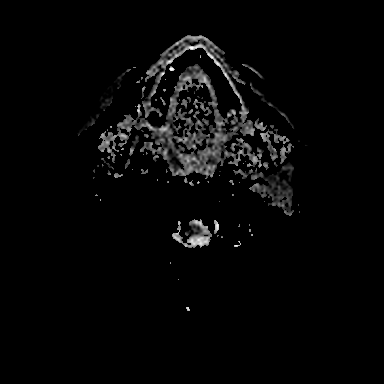
[im 23/46]
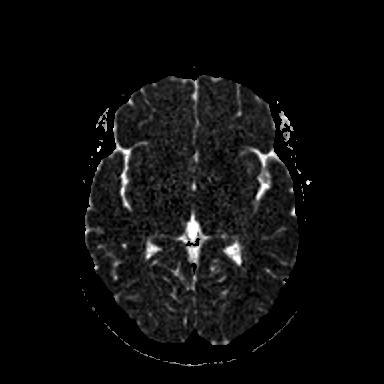
[im 46/46]
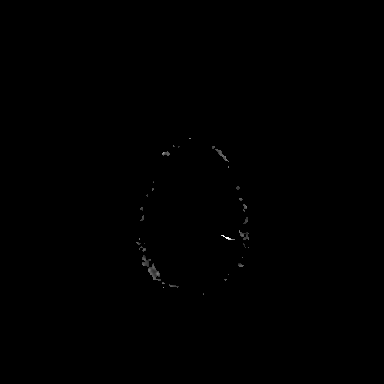

[Series 11: cor dwi_tracew · coronal · 5.0mm · 0.60mm/px · 2 of 38 slices shown]
[im 1/38]
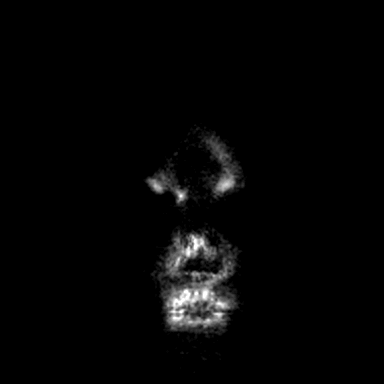
[im 38/38]
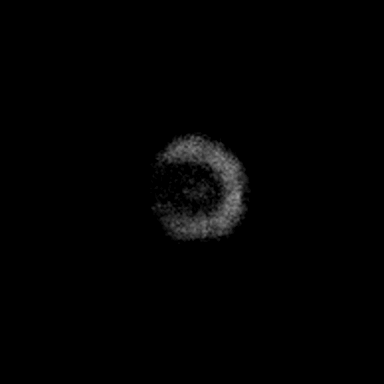

[Series 12: cor dwi_adc · coronal · 5.0mm · 0.60mm/px · 2 of 36 slices shown]
[im 1/36]
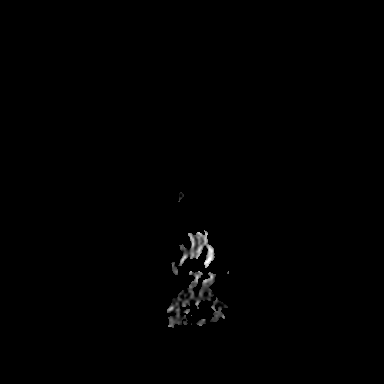
[im 36/36]
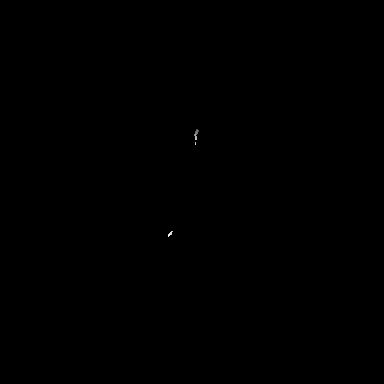

[Series 13: T1 · sagittal · 5.0mm · 0.62mm/px · 1 of 21 slices shown (1 of 2)]
[im 1/21]
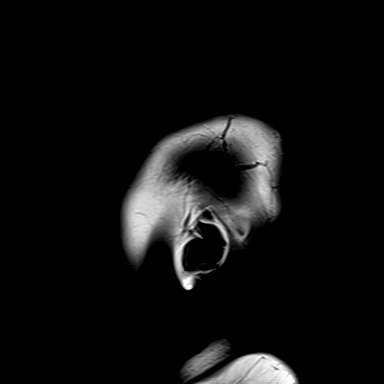

[Series 14: FLAIR · sagittal · 5.0mm · 0.94mm/px · 1 of 21 slices shown (1 of 2)]
[im 1/21]
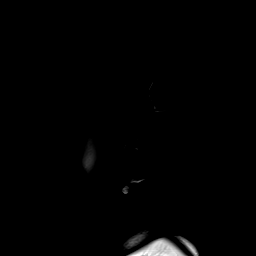

[Series 15: T2 · axial · 5.0mm · 0.53mm/px · z∈[-102,+42]mm · 2 of 25 slices shown (1 of 2)]
[im 1/25]
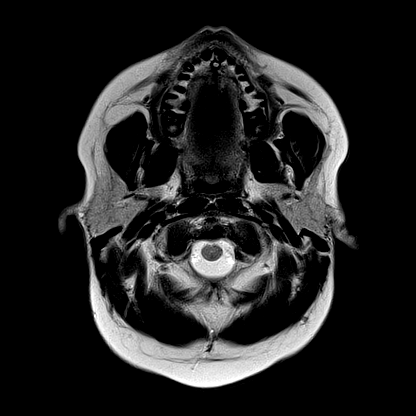
[im 25/25]
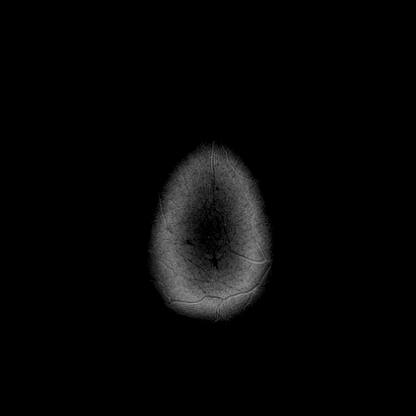

[Series 20: FLAIR · axial · 3.0mm · 0.53mm/px · z∈[-107,+43]mm · 3 of 51 slices shown (2 of 2)]
[im 1/51]
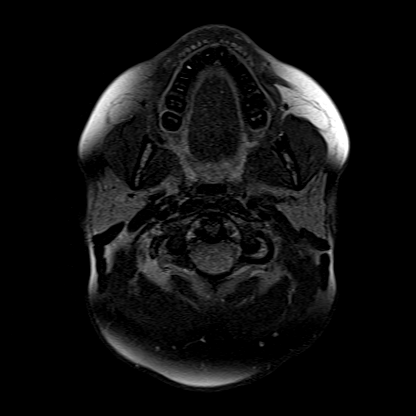
[im 26/51]
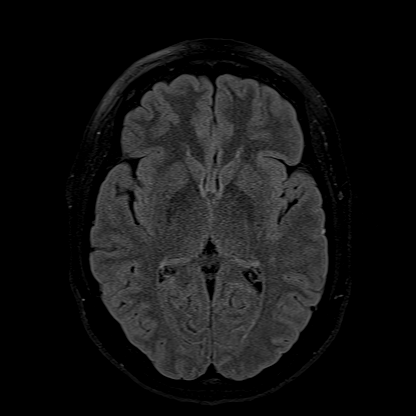
[im 51/51]
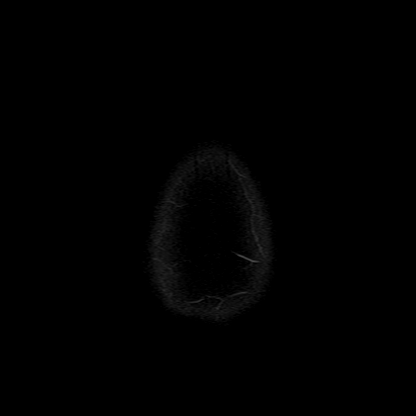

[Series 21: T1 · axial · 1.0mm · 0.98mm/px · z∈[-102,+41]mm · 8 of 144 slices shown (2 of 2)]
[im 1/144]
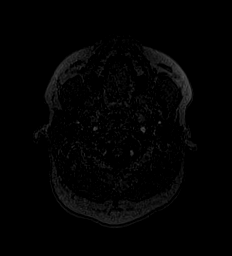
[im 18/144]
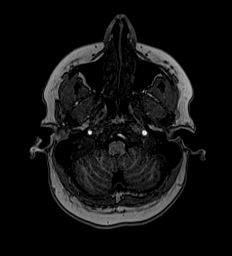
[im 36/144]
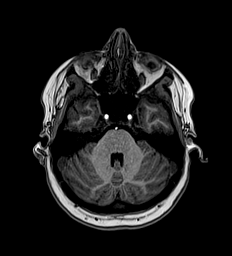
[im 54/144]
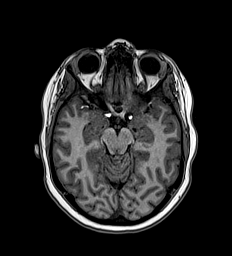
[im 90/144]
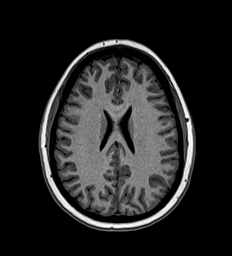
[im 108/144]
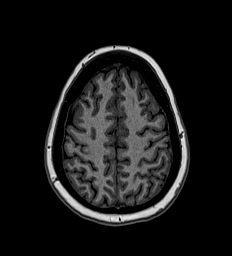
[im 126/144]
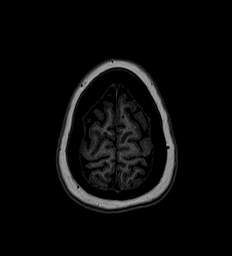
[im 144/144]
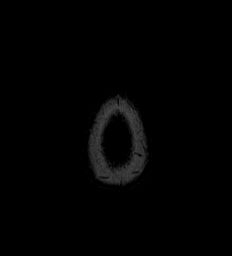

[Series 22: T2 · coronal · 5.0mm · 0.57mm/px · 2 of 29 slices shown (2 of 2)]
[im 1/29]
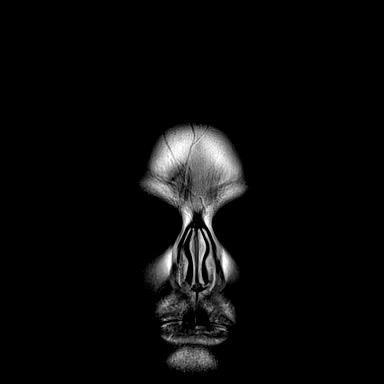
[im 29/29]
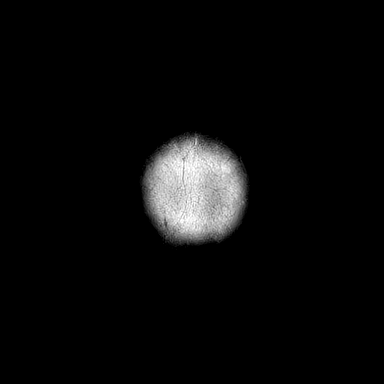

[Series 23: T1 post-contrast · axial · 1.0mm · 0.98mm/px · z∈[-102,+41]mm · 9 of 144 slices shown (1 of 2)]
[im 1/144]
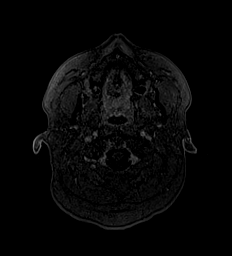
[im 18/144]
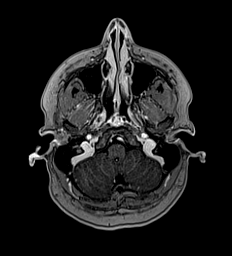
[im 36/144]
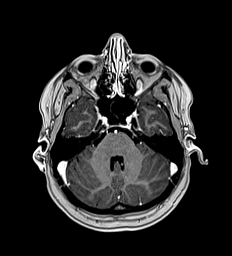
[im 54/144]
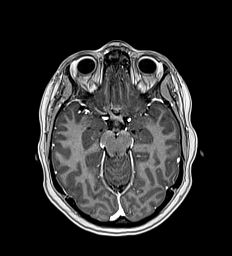
[im 72/144]
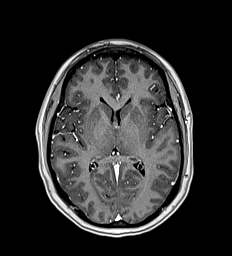
[im 90/144]
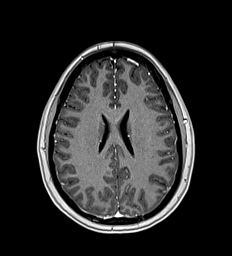
[im 108/144]
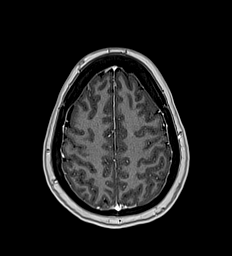
[im 126/144]
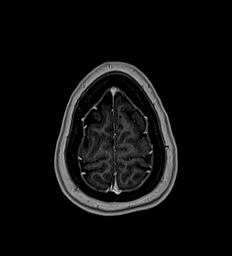
[im 144/144]
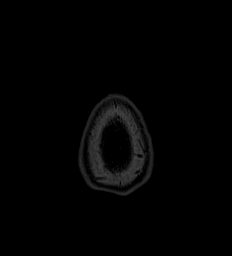

[Series 24: T1 post-contrast · coronal · 5.0mm · 0.57mm/px · 2 of 29 slices shown (2 of 2)]
[im 1/29]
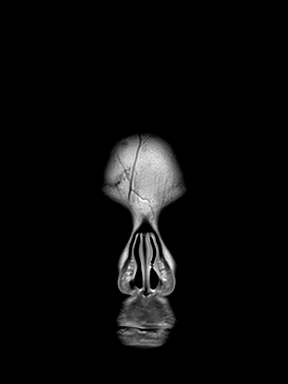
[im 29/29]
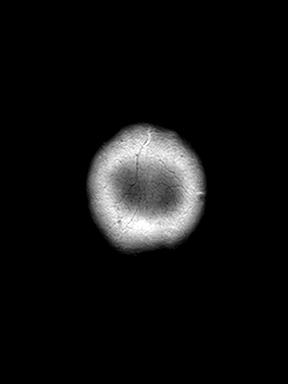

[38 of 48 positions shown; findings below may reference images not displayed]

FINDINGS: MRI HEAD FINDINGS

Brain: Cerebral volume within normal limits for age. Few scattered
subcentimeter foci of T2/FLAIR hyperintensity seen involving the
periventricular and juxtacortical white matter of both cerebral
hemispheres. Specifically, these are seen at the juxtacortical right
parietal lobe (series 20, image 29), left periatrial white matter
(series 20, image 27), and juxtacortical anterior left temporal pole
(series 20, image 19). Associated T1 black hole seen at the left
periatrial lesion. Findings are presumably related to history of
multiple sclerosis. No associated enhancement or restricted
diffusion to suggest active demyelination.

No other focal parenchymal signal abnormality identified. No
evidence for acute or subacute infarct. No encephalomalacia to
suggest chronic cortical infarction. No foci of susceptibility
artifact to suggest acute or chronic intracranial hemorrhage.

No mass lesion, midline shift or mass effect. No hydrocephalus. No
extra-axial fluid collection. Pituitary gland and suprasellar region
normal. No other abnormal enhancement within the right.

Vascular: Major intracranial vascular flow voids are well
maintained.

Skull and upper cervical spine: Craniocervical junction within
normal limits. Bone marrow signal intensity normal. No scalp soft
tissue abnormality.

Sinuses/Orbits: Globes and orbital soft tissues within normal
limits. Paranasal sinuses are clear. Trace fluid signal intensity
seen at the inferior right mastoid air cells, of doubtful
significance.

Other: None.

MRI CERVICAL SPINE FINDINGS

Alignment: Examination mildly degraded by motion artifact.

Straightening of the normal cervical lordosis.  No listhesis.

Vertebrae: Vertebral body height maintained without evidence for
acute or chronic fracture. Signal intensity within the visualized
bone marrow diffusely decreased on T1 weighted imaging, nonspecific,
but most commonly related to anemia, smoking, or obesity. Few small
benign hemangiomata noted within the C6 and T3 vertebral bodies. No
other discrete or worrisome osseous lesions. No abnormal marrow
edema or enhancement.

Cord: Subtle patchy cord signal abnormality seen within the left
dorsal cord at the level of C3 (series 10, image 5). Additional
patchy cord signal abnormality seen within the right hemi cord at
the level of C4-5 (series 10, image 12). Patchy signal abnormality
within the left hemi cord at the level of C5 (series 10, image 13).
Focal signal abnormality within the left hemi cord at the level of
C6-7 (series 10, image 20). Findings consistent with history of
multiple sclerosis. No associated enhancement or cord edema to
suggest active demyelination. Underlying cord caliber within normal
limits.

Posterior Fossa, vertebral arteries, paraspinal tissues:
Craniocervical junction normal. Paraspinous and prevertebral soft
tissues within normal limits. Normal intravascular flow voids seen
within the vertebral arteries bilaterally.

Disc levels:

C2-C3: Unremarkable.

C3-C4: Mild disc bulge with uncovertebral hypertrophy. No
significant canal stenosis. Mild left C4 foraminal narrowing. No
significant right foraminal encroachment.

C4-C5:  Minimal disc bulge.  No canal or foraminal stenosis.

C5-C6: Small right paracentral disc protrusion indents the right
ventral thecal sac. No significant spinal stenosis. Foramina remain
patent.

C6-C7:  Unremarkable.

C7-T1:  Unremarkable.

Visualized upper thoracic spine demonstrates no significant finding.
IMPRESSION: MRI HEAD IMPRESSION:

1. Few scattered subcentimeter T2/FLAIR hyperintensities involving
the supratentorial cerebral white matter as above, nonspecific, but
presumably related to history of multiple sclerosis. No evidence for
active demyelination.
2. Otherwise normal brain MRI for age.

MRI CERVICAL SPINE IMPRESSION:

1. Patchy signal abnormality involving the cervical spinal cord as
detailed above, consistent with history of multiple sclerosis. No
evidence for active demyelination.
2. Shallow right paracentral disc protrusion at C5-6 without
significant stenosis.
3. Mild left C4 foraminal narrowing related to disc bulge and
uncovertebral disease.

## 2019-02-25 IMAGING — MR MR CERVICAL SPINE WO/W CM
7 of 10 series · 31 of 48 positions shown · IV contrast (gadavist)
Comparison: None available.

CLINICAL DATA: Follow-up examination for multiple sclerosis. No new
symptoms or complaints.

EXAM:
MRI HEAD WITHOUT AND WITH CONTRAST
MRI CERVICAL SPINE WITHOUT AND WITH CONTRAST
TECHNIQUE: Multiplanar, multiecho pulse sequences of the brain and surrounding
structures, and cervical spine, to include the craniocervical
junction and cervicothoracic junction, were obtained without and
with intravenous contrast.
CONTRAST:  9mL GADAVIST GADOBUTROL 1 MMOL/ML IV SOLN

[Series 5: T2 · sagittal · 3.0mm · 0.62mm/px · 4 of 13 slices shown (1 of 3)]
[im 1/13]
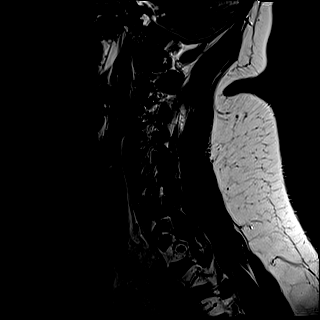
[im 5/13]
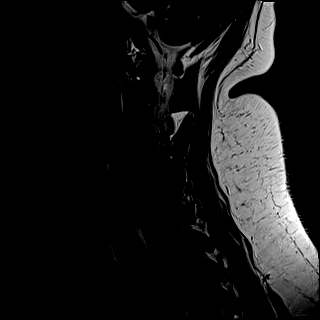
[im 9/13]
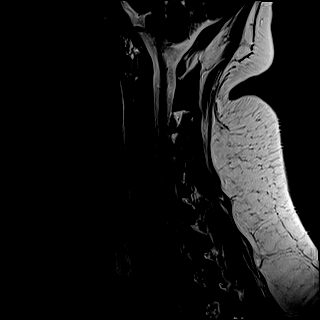
[im 13/13]
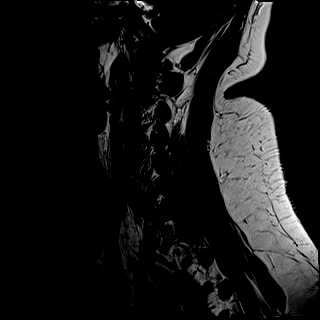

[Series 7: STIR · sagittal · 3.0mm · 0.62mm/px · 4 of 13 slices shown (1 of 2)]
[im 1/13]
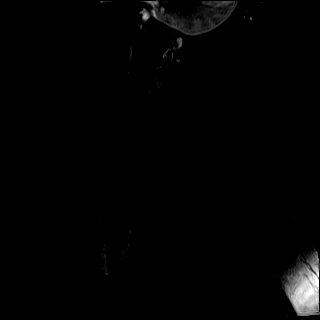
[im 5/13]
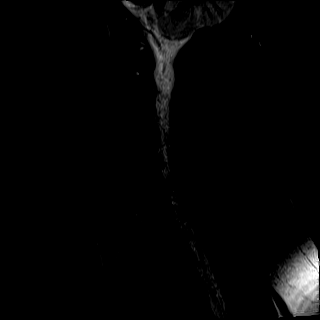
[im 9/13]
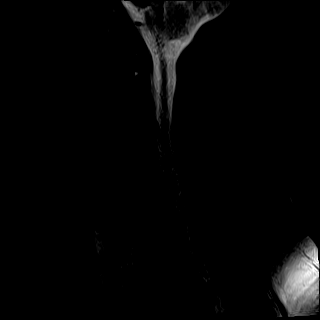
[im 13/13]
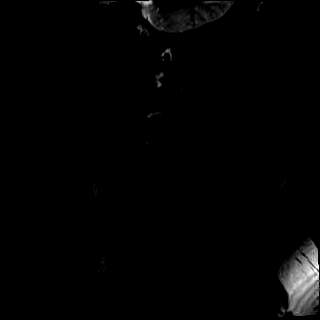

[Series 8: T2 · axial · 3.0mm · 0.70mm/px · z∈[-236,-147]mm · 6 of 27 slices shown (2 of 3)]
[im 1/27]
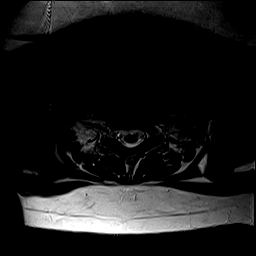
[im 6/27]
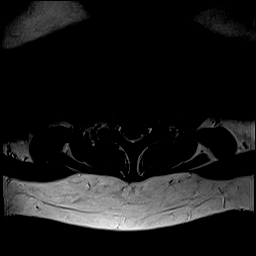
[im 11/27]
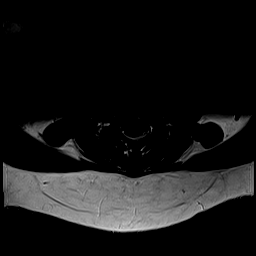
[im 16/27]
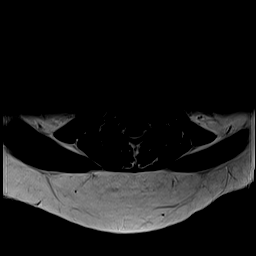
[im 21/27]
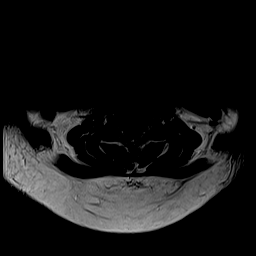
[im 27/27]
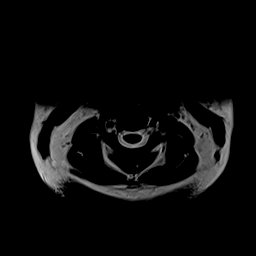

[Series 10: T2 · axial · 3.0mm · 0.70mm/px · z∈[-236,-147]mm · 6 of 27 slices shown (3 of 3)]
[im 1/27]
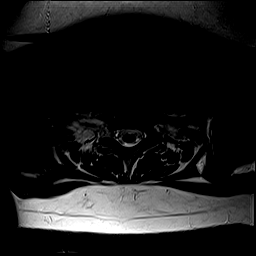
[im 6/27]
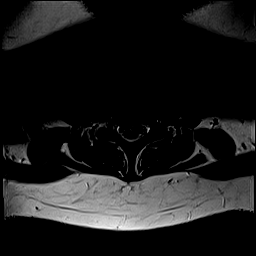
[im 11/27]
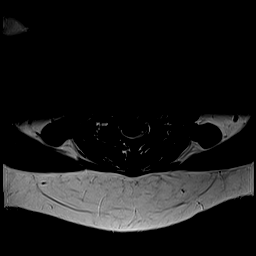
[im 16/27]
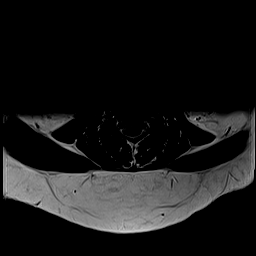
[im 21/27]
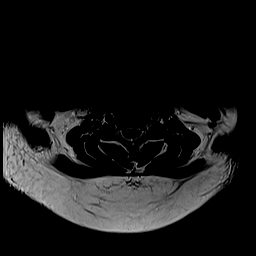
[im 27/27]
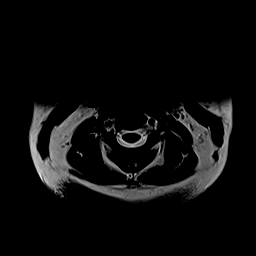

[Series 11: STIR · sagittal · 3.0mm · 0.62mm/px · 3 of 13 slices shown (2 of 2)]
[im 1/13]
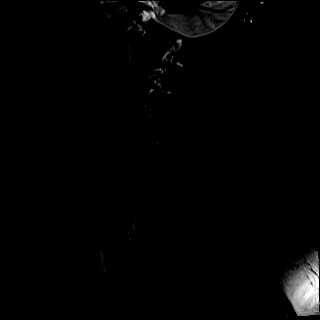
[im 7/13]
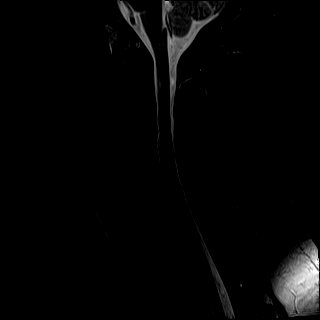
[im 13/13]
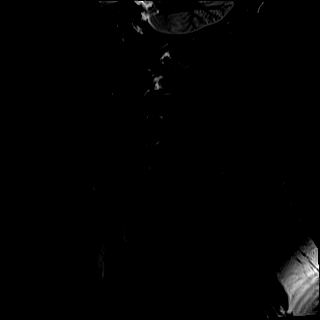

[Series 12: T1 · axial · non-contrast · 3.0mm · 0.35mm/px · z∈[-236,-147]mm · 6 of 27 slices shown]
[im 1/27]
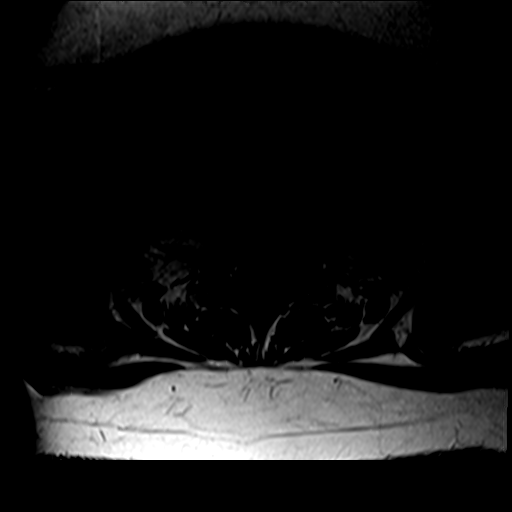
[im 6/27]
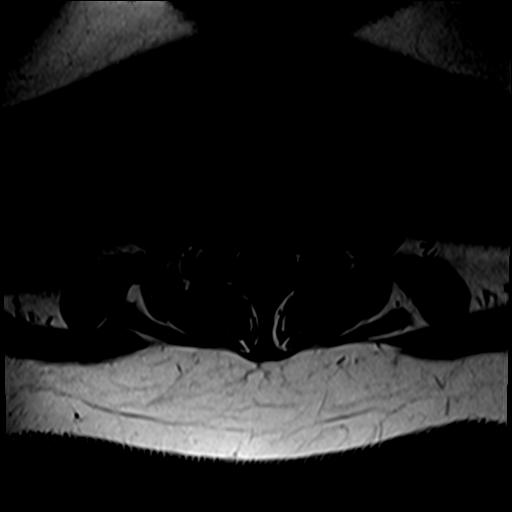
[im 11/27]
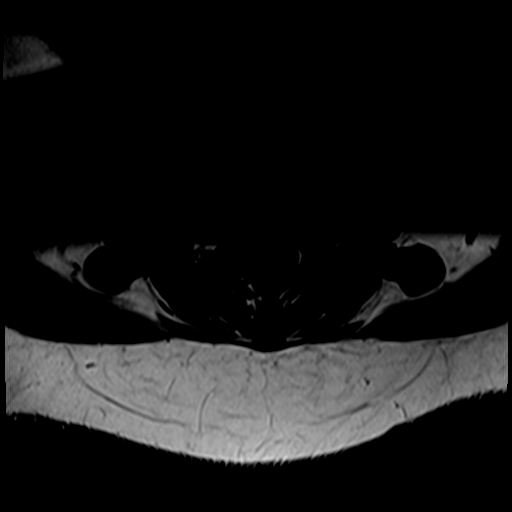
[im 16/27]
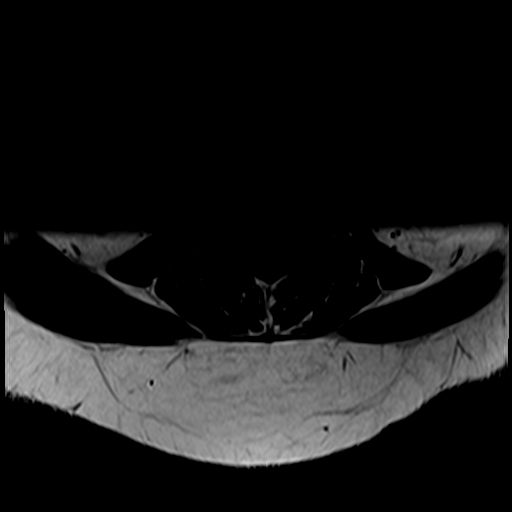
[im 21/27]
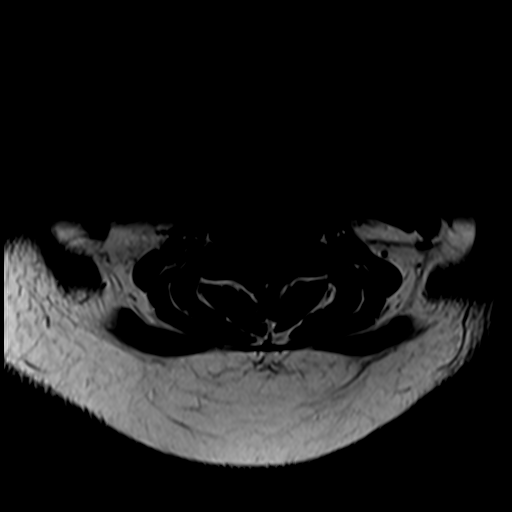
[im 27/27]
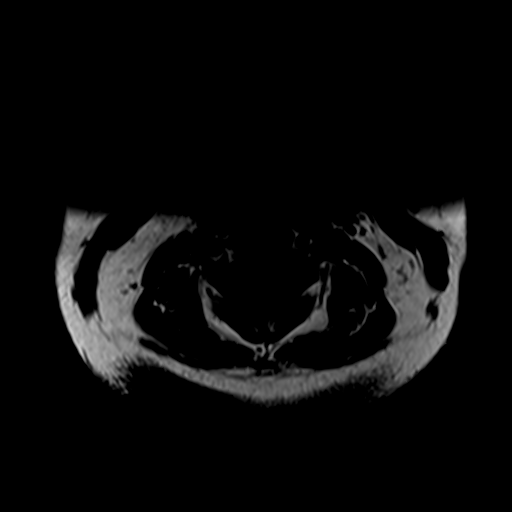

[Series 14: T1 post-contrast · axial · 3.0mm · 0.35mm/px · z∈[-236,-219]mm · 2 of 27 slices shown]
[im 1/27]
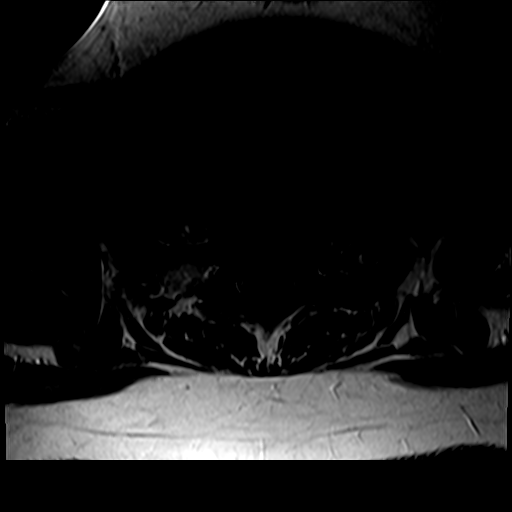
[im 6/27]
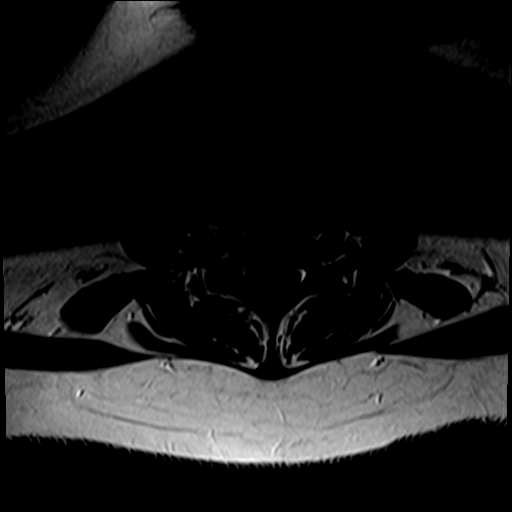

[31 of 48 positions shown; findings below may reference images not displayed]

FINDINGS: MRI HEAD FINDINGS

Brain: Cerebral volume within normal limits for age. Few scattered
subcentimeter foci of T2/FLAIR hyperintensity seen involving the
periventricular and juxtacortical white matter of both cerebral
hemispheres. Specifically, these are seen at the juxtacortical right
parietal lobe (series 20, image 29), left periatrial white matter
(series 20, image 27), and juxtacortical anterior left temporal pole
(series 20, image 19). Associated T1 black hole seen at the left
periatrial lesion. Findings are presumably related to history of
multiple sclerosis. No associated enhancement or restricted
diffusion to suggest active demyelination.

No other focal parenchymal signal abnormality identified. No
evidence for acute or subacute infarct. No encephalomalacia to
suggest chronic cortical infarction. No foci of susceptibility
artifact to suggest acute or chronic intracranial hemorrhage.

No mass lesion, midline shift or mass effect. No hydrocephalus. No
extra-axial fluid collection. Pituitary gland and suprasellar region
normal. No other abnormal enhancement within the right.

Vascular: Major intracranial vascular flow voids are well
maintained.

Skull and upper cervical spine: Craniocervical junction within
normal limits. Bone marrow signal intensity normal. No scalp soft
tissue abnormality.

Sinuses/Orbits: Globes and orbital soft tissues within normal
limits. Paranasal sinuses are clear. Trace fluid signal intensity
seen at the inferior right mastoid air cells, of doubtful
significance.

Other: None.

MRI CERVICAL SPINE FINDINGS

Alignment: Examination mildly degraded by motion artifact.

Straightening of the normal cervical lordosis.  No listhesis.

Vertebrae: Vertebral body height maintained without evidence for
acute or chronic fracture. Signal intensity within the visualized
bone marrow diffusely decreased on T1 weighted imaging, nonspecific,
but most commonly related to anemia, smoking, or obesity. Few small
benign hemangiomata noted within the C6 and T3 vertebral bodies. No
other discrete or worrisome osseous lesions. No abnormal marrow
edema or enhancement.

Cord: Subtle patchy cord signal abnormality seen within the left
dorsal cord at the level of C3 (series 10, image 5). Additional
patchy cord signal abnormality seen within the right hemi cord at
the level of C4-5 (series 10, image 12). Patchy signal abnormality
within the left hemi cord at the level of C5 (series 10, image 13).
Focal signal abnormality within the left hemi cord at the level of
C6-7 (series 10, image 20). Findings consistent with history of
multiple sclerosis. No associated enhancement or cord edema to
suggest active demyelination. Underlying cord caliber within normal
limits.

Posterior Fossa, vertebral arteries, paraspinal tissues:
Craniocervical junction normal. Paraspinous and prevertebral soft
tissues within normal limits. Normal intravascular flow voids seen
within the vertebral arteries bilaterally.

Disc levels:

C2-C3: Unremarkable.

C3-C4: Mild disc bulge with uncovertebral hypertrophy. No
significant canal stenosis. Mild left C4 foraminal narrowing. No
significant right foraminal encroachment.

C4-C5:  Minimal disc bulge.  No canal or foraminal stenosis.

C5-C6: Small right paracentral disc protrusion indents the right
ventral thecal sac. No significant spinal stenosis. Foramina remain
patent.

C6-C7:  Unremarkable.

C7-T1:  Unremarkable.

Visualized upper thoracic spine demonstrates no significant finding.
IMPRESSION: MRI HEAD IMPRESSION:

1. Few scattered subcentimeter T2/FLAIR hyperintensities involving
the supratentorial cerebral white matter as above, nonspecific, but
presumably related to history of multiple sclerosis. No evidence for
active demyelination.
2. Otherwise normal brain MRI for age.

MRI CERVICAL SPINE IMPRESSION:

1. Patchy signal abnormality involving the cervical spinal cord as
detailed above, consistent with history of multiple sclerosis. No
evidence for active demyelination.
2. Shallow right paracentral disc protrusion at C5-6 without
significant stenosis.
3. Mild left C4 foraminal narrowing related to disc bulge and
uncovertebral disease.

## 2019-02-25 MED ORDER — GADOBUTROL 1 MMOL/ML IV SOLN
9.0000 mL | Freq: Once | INTRAVENOUS | Status: AC | PRN
  Administered 2019-02-25: 9 mL via INTRAVENOUS

## 2019-02-27 ENCOUNTER — Other Ambulatory Visit: Payer: Self-pay

## 2019-02-27 ENCOUNTER — Ambulatory Visit
Admission: RE | Admit: 2019-02-27 | Discharge: 2019-02-27 | Disposition: A | Discharge status: Home / Self Care | Payer: BC Managed Care – PPO | Source: Ambulatory Visit | Attending: Podiatry | Admitting: Podiatry

## 2019-02-27 DIAGNOSIS — S86011A Strain of right Achilles tendon, initial encounter: Secondary | ICD-10-CM | POA: Diagnosis present

## 2019-02-27 IMAGING — MR MR ANKLE*R* W/O CM
6 series · 40 of 40 positions shown · non-contrast
Comparison: None.

CLINICAL DATA: Right ankle pain with a knot on the posterior aspect
of the ankle for 3 weeks.

EXAM:
MRI OF THE RIGHT ANKLE WITHOUT CONTRAST
TECHNIQUE: Multiplanar, multisequence MR imaging of the ankle was performed. No
intravenous contrast was administered.

[Series 3: PD fat-sat · axial · right · 3.0mm · 0.50mm/px · z∈[-66,+74]mm · 8 of 36 slices shown]
[im 1/36]
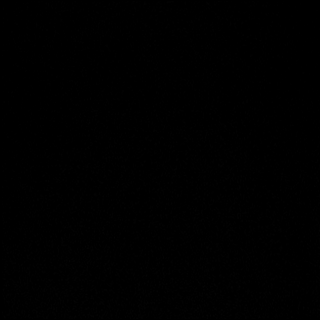
[im 6/36]
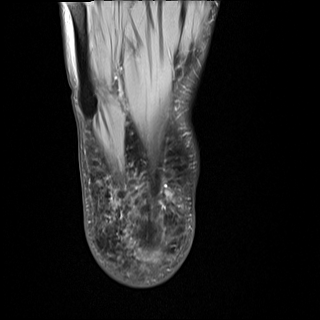
[im 11/36]
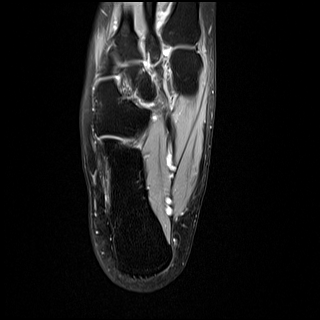
[im 16/36]
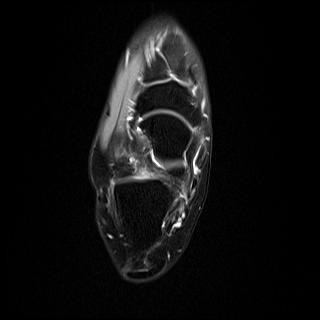
[im 21/36]
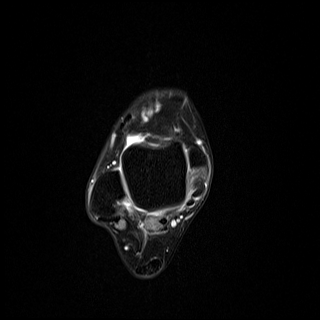
[im 26/36]
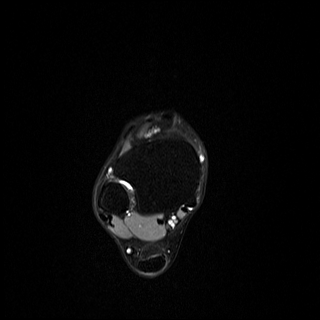
[im 31/36]
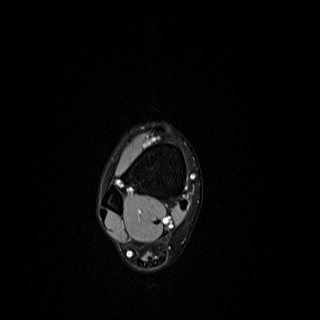
[im 36/36]
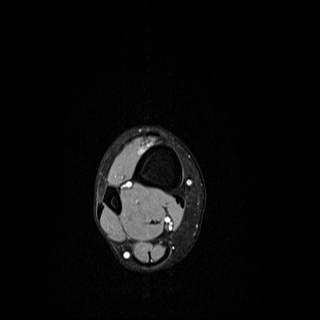

[Series 4: T2 fat-sat · axial · right · 3.0mm · 0.50mm/px · z∈[-66,+74]mm · 8 of 36 slices shown (1 of 2)]
[im 1/36]
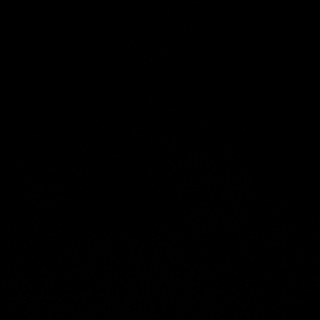
[im 6/36]
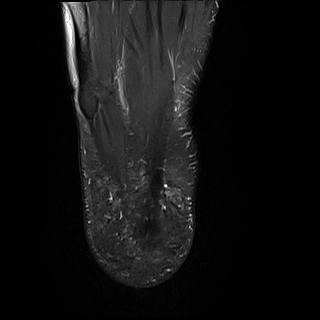
[im 11/36]
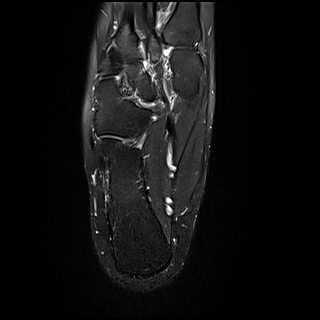
[im 16/36]
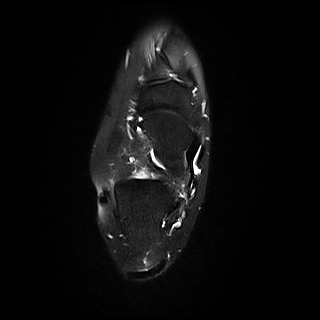
[im 21/36]
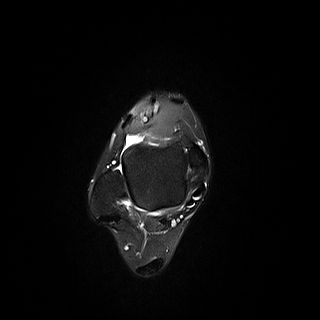
[im 26/36]
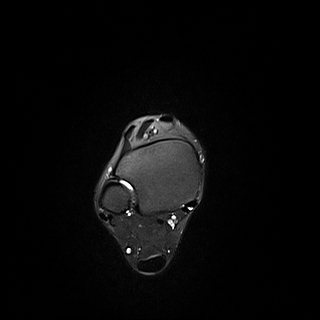
[im 31/36]
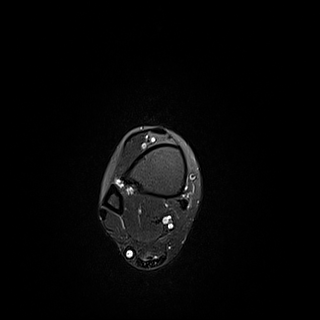
[im 36/36]
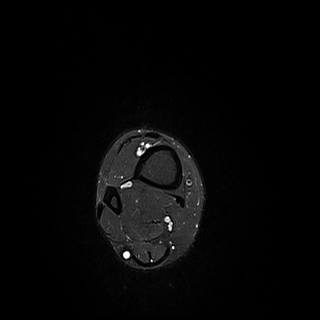

[Series 5: T2 fat-sat · coronal · right · 3.0mm · 0.62mm/px · 8 of 34 slices shown (2 of 2)]
[im 1/34]
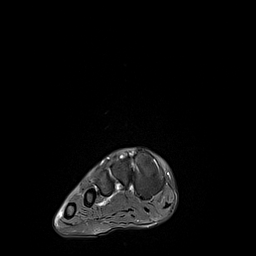
[im 5/34]
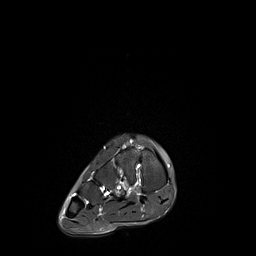
[im 10/34]
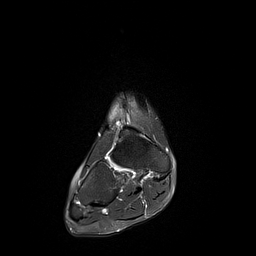
[im 15/34]
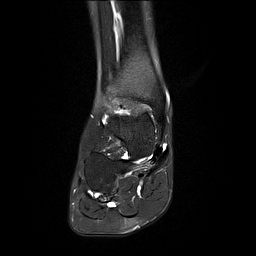
[im 19/34]
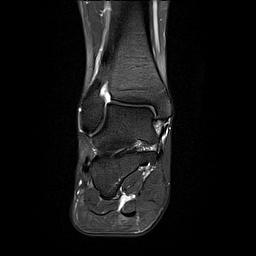
[im 24/34]
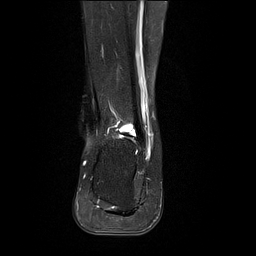
[im 29/34]
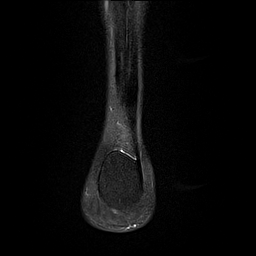
[im 34/34]
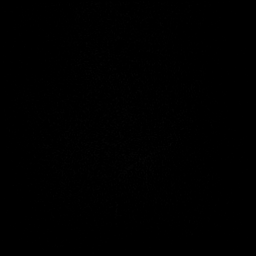

[Series 6: T1 · sagittal · right · 4.0mm · 0.62mm/px · 4 of 18 slices shown (1 of 2)]
[im 1/18]
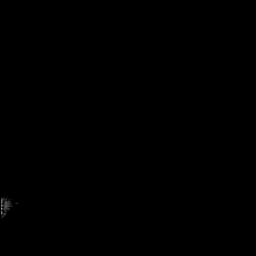
[im 6/18]
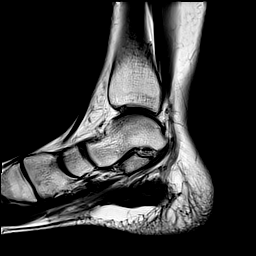
[im 12/18]
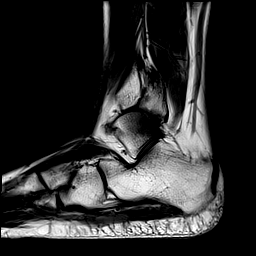
[im 18/18]
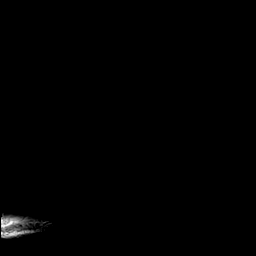

[Series 7: STIR · sagittal · right · 4.0mm · 0.31mm/px · 4 of 18 slices shown]
[im 1/18]
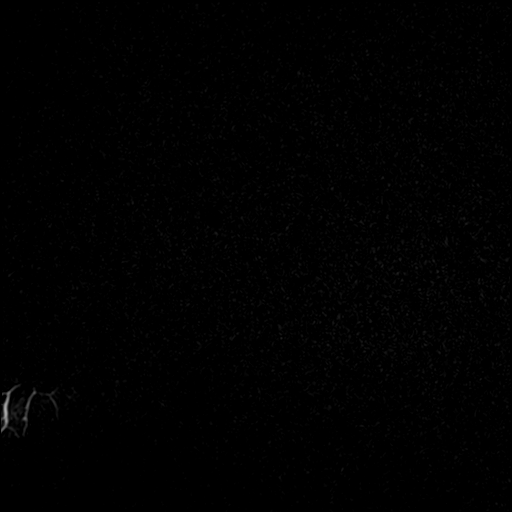
[im 6/18]
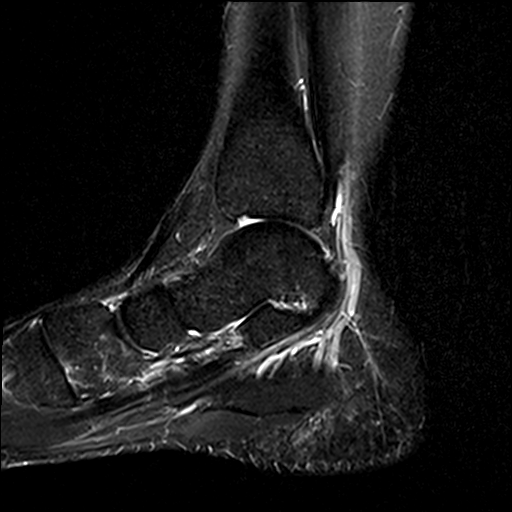
[im 12/18]
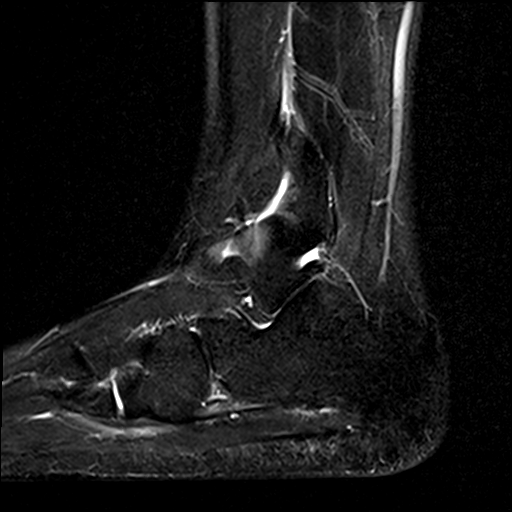
[im 18/18]
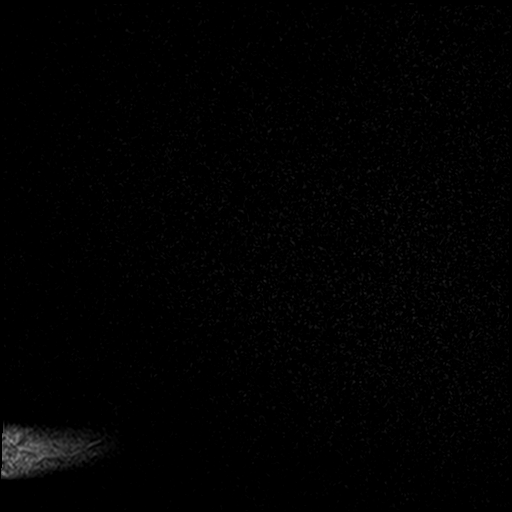

[Series 8: T1 · axial · right · 3.0mm · 0.50mm/px · z∈[-66,+74]mm · 8 of 36 slices shown (2 of 2)]
[im 1/36]
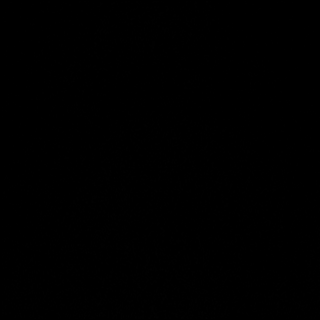
[im 6/36]
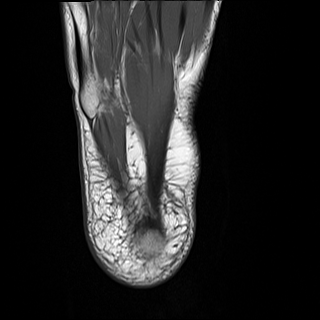
[im 11/36]
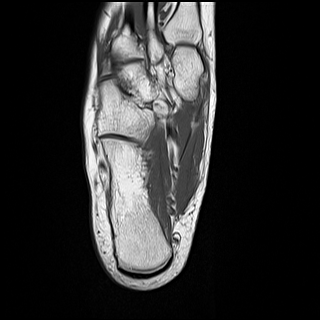
[im 16/36]
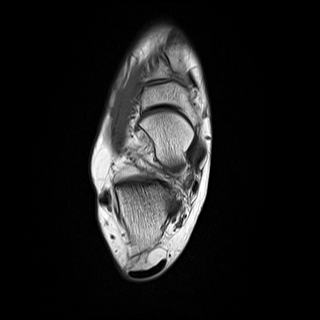
[im 21/36]
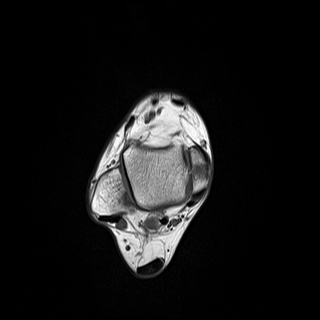
[im 26/36]
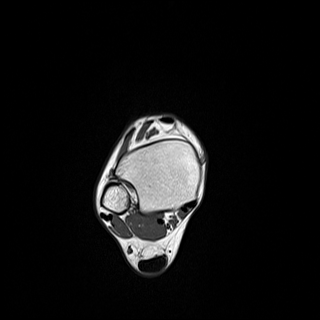
[im 31/36]
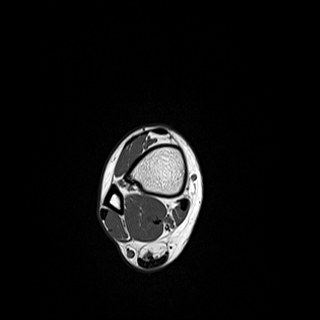
[im 36/36]
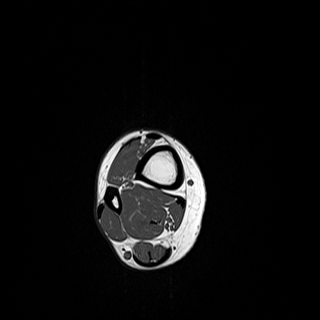

[40 of 40 positions shown; findings below may reference images not displayed]

FINDINGS: TENDONS

Peroneal: Intact.

Posteromedial: Intact. There is some fluid in the sheath of the
tibialis posterior just proximal to its insertion on the navicular.

Anterior: Intact.

Achilles: Thickening of the Achilles tendon is seen extending
approximately 4 cm craniocaudal and originating 2 cm above the
tendon insertion on the calcaneus. Mild intrasubstance increased T2
signal is seen within the tendon but there is no tear.

Plantar Fascia: Normal.

LIGAMENTS

Lateral: Intact.

Medial: Intact.

CARTILAGE

Ankle Joint: Negative.

Subtalar Joints/Sinus Tarsi: Normal.

Bones: Normal marrow signal throughout.

Other: None.
IMPRESSION: 1. Noninsertional Achilles tendinopathy without tear.
2. Fluid in the sheath of the tibialis posterior just proximal to
its insertion on the navicular is compatible with tenosynovitis. The
tendon is intact.

## 2019-03-24 DIAGNOSIS — Z1589 Genetic susceptibility to other disease: Secondary | ICD-10-CM | POA: Insufficient documentation

## 2020-03-29 ENCOUNTER — Other Ambulatory Visit: Payer: Self-pay | Admitting: Neurology

## 2020-03-29 ENCOUNTER — Other Ambulatory Visit
Admission: RE | Admit: 2020-03-29 | Discharge: 2020-03-29 | Disposition: A | Discharge status: Home / Self Care | Payer: BC Managed Care – PPO | Source: Ambulatory Visit | Attending: Neurology | Admitting: Neurology

## 2020-03-29 ENCOUNTER — Other Ambulatory Visit (HOSPITAL_COMMUNITY): Payer: Self-pay | Admitting: Neurology

## 2020-03-29 DIAGNOSIS — G35 Multiple sclerosis: Secondary | ICD-10-CM

## 2020-03-29 LAB — APTT: aPTT: 29 seconds (ref 24–36)

## 2020-04-07 NOTE — L&D Delivery Note (Signed)
Delivery Note  Kelly David is a [id number] at [redacted]w[redacted]d with an LMP of 06/21/2020, consistent with [place] at [redacted]w[redacted]d.   First Stage: Labor onset: 1900 Induction: misoprostol and oxytocin Analgesia /Anesthesia intrapartum: Epidural SROM at 2129  Second Stage: Complete dilation at 2247 Onset of pushing at 2250 FHR second stage 140 bpm with moderate variability, variables with contractions   Kelly David presented to L&D for scheduled IOL.  2 doses of misoprostol was used for cervical ripening.  1st dose of PCN was given when she reported mild cramping and cervix was 3-4/50/-2.  Oxytocin was initially started at 2 milliunits/min but then turned off an hour later when membranes spontaneously ruptured to allow time for 2nd dose of PCN.  She received an epidural and progressed quickly to C/C/+2 with an urge to push.  She pushed well over 2 minutes for a spontaneous vaginal birth.   Delivery of a viable baby girl on 03/23/2021 at 2252 by CNM Delivery of fetal head in OA position with restitution to LOT. Loose nuchal cord x 1, easily reduced;  Anterior then posterior shoulders delivered easily with gentle downward traction. Baby placed on mom's chest, and attended to by baby RN Cord double clamped after cessation of pulsation, cut by father of baby  Cord blood sample collection: Yes O POS  Third Stage: Oxytocin bolus started after delivery of infant for hemorrhage prophylaxis  Placenta delivered intact with 3 VC @ 2259 Placenta disposition: discarded  Uterine tone firm with massage / bleeding brisk  Brisk bleeding noted prior to placenta with a large gush with clots as placenta delivered.  Fundal massage performed, expressed several large clots with large gush of blood.  Kelly David reported feeling dizzy and "like I'm going to pass out".  Additional personnel requested to room.  Given Methergine IM.  LR IVF bolus started. Foley catheter inserted.  Uterine sweep performed and several moderate to large clots  expressed.  Misoprostol given buccally.  HeatherFundus became firm and bleeding decreased to small with fundal massages.    1st perineal laceration identified - hemostatic, approximates well Anesthesia for repair: N/A Repair: N/A Est. Blood Loss (mL): 1100  Complications: Postpartum hemorrhage  Mom to postpartum.  Baby to Couplet care / Skin to Skin.  Newborn: Information for the patient's newborn:  Kelly David, Kelly David [[id number]]  Live born female "Dahlia Client" Birth Weight:  9lbs 1oz APGAR: 8, 8  Newborn Delivery   Birth date/time: 03/[birth year]/2021 22:53:00 Delivery type: Vaginal, Spontaneous     Feeding planned: Formula   ---------- Margaretmary Eddy, CNM Certified Nurse Midwife [facility]

## 2020-04-09 ENCOUNTER — Ambulatory Visit
Admission: RE | Admit: 2020-04-09 | Discharge: 2020-04-09 | Disposition: A | Discharge status: Home / Self Care | Payer: BC Managed Care – PPO | Source: Ambulatory Visit | Attending: Neurology | Admitting: Neurology

## 2020-04-09 ENCOUNTER — Other Ambulatory Visit: Payer: Self-pay

## 2020-04-09 DIAGNOSIS — G35 Multiple sclerosis: Secondary | ICD-10-CM

## 2020-04-09 IMAGING — MR MR HEAD WO/W CM
1 series · 22 of 31 positions shown · IV contrast (9ml Gadavist)
Comparison: Brain MRI [DATE].

CLINICAL DATA: Relapsing remitting multiple sclerosis. Additional
history provided: Exacerbation with right lower extremity burning
and pins/needles sensation starting [DATE], currently off of MS
meds.

EXAM:
MRI HEAD WITHOUT AND WITH CONTRAST
TECHNIQUE: Multiplanar, multiecho pulse sequences of the brain and surrounding
structures were obtained without and with intravenous contrast.
CONTRAST:  9mL GADAVIST GADOBUTROL 1 MMOL/ML IV SOLN

[Series 13: T2 · coronal · 5.0mm · 0.45mm/px · 22 of 31 slices shown]
[im 1/31]
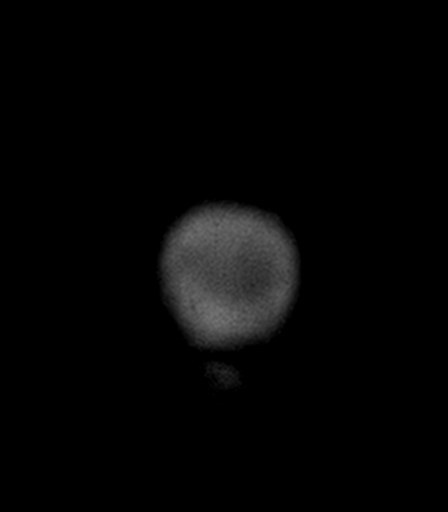
[im 2/31]
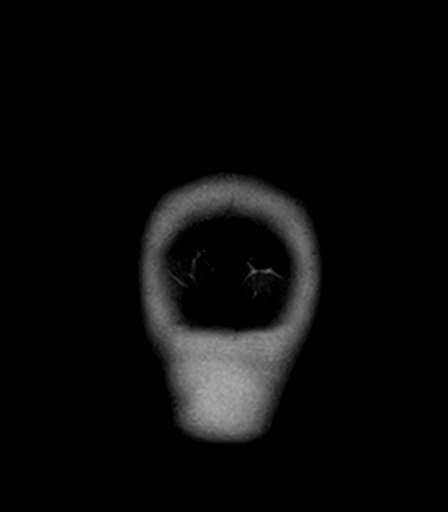
[im 3/31]
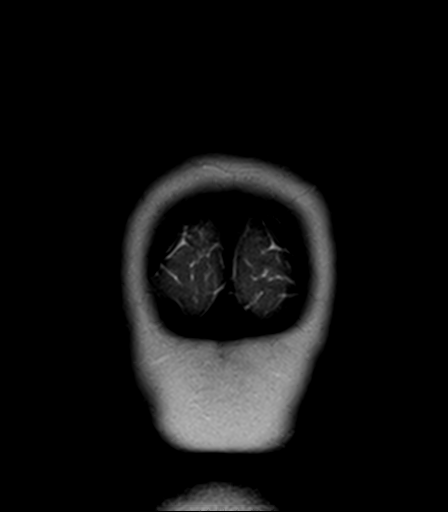
[im 4/31]
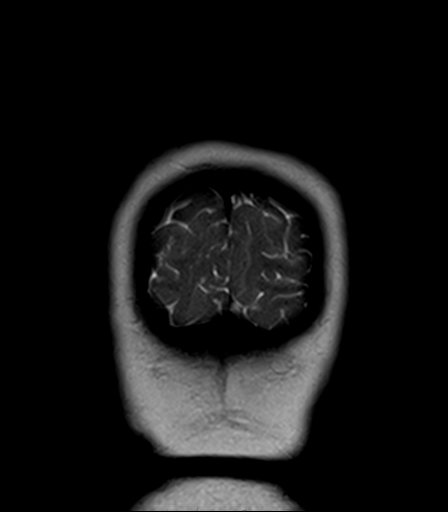
[im 5/31]
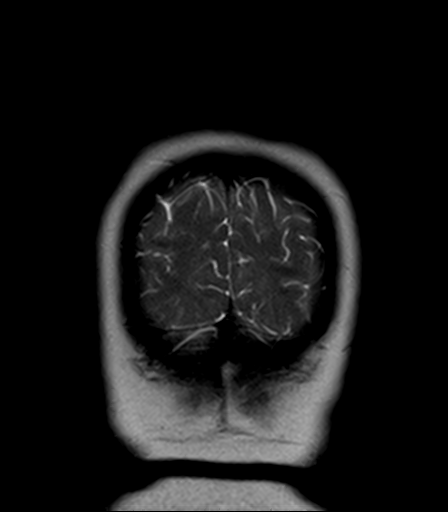
[im 6/31]
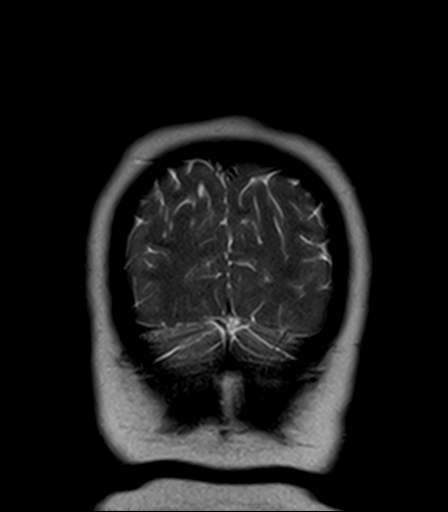
[im 7/31]
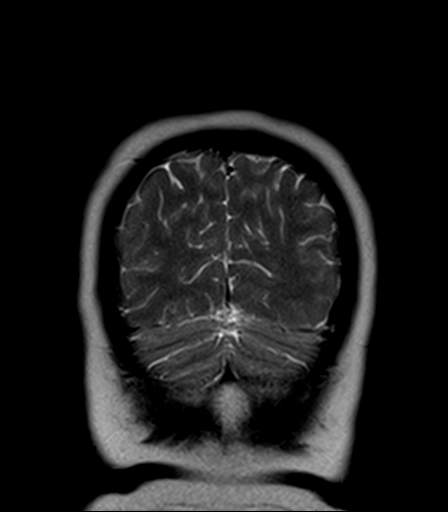
[im 8/31]
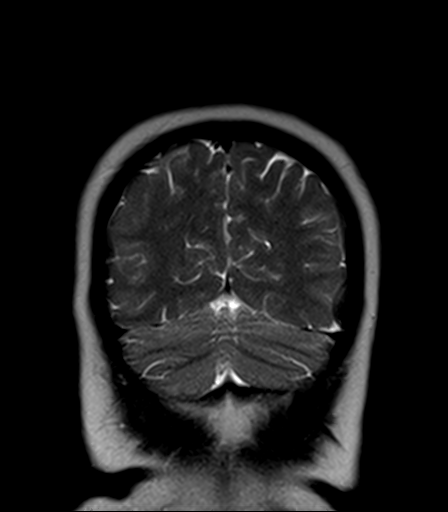
[im 9/31]
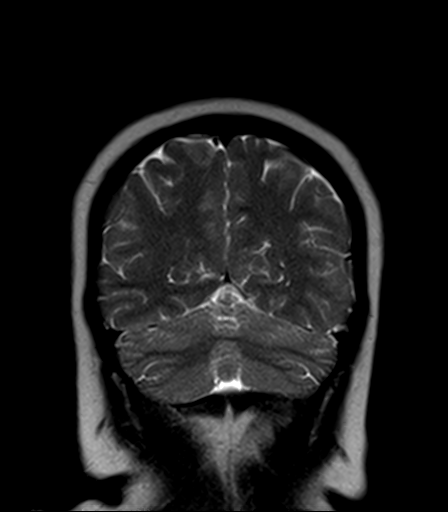
[im 10/31]
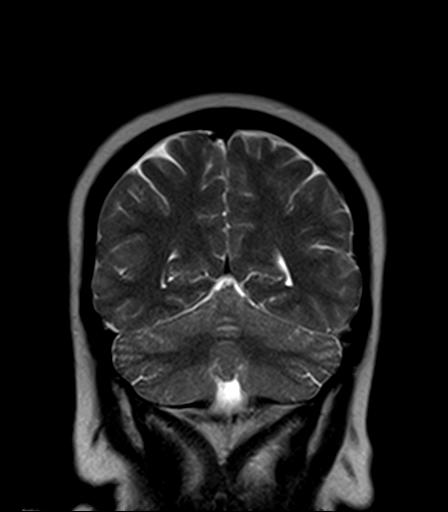
[im 11/31]
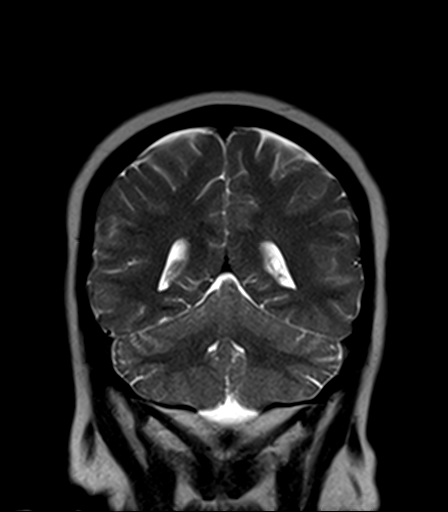
[im 12/31]
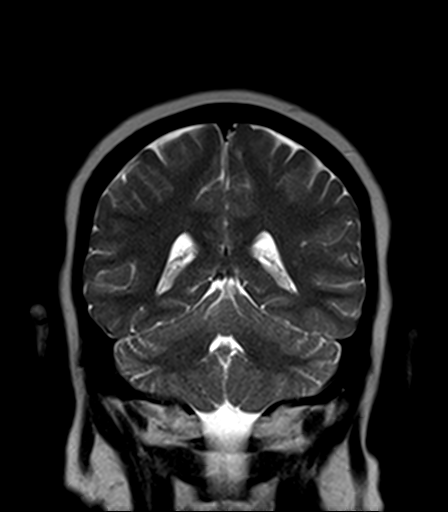
[im 13/31]
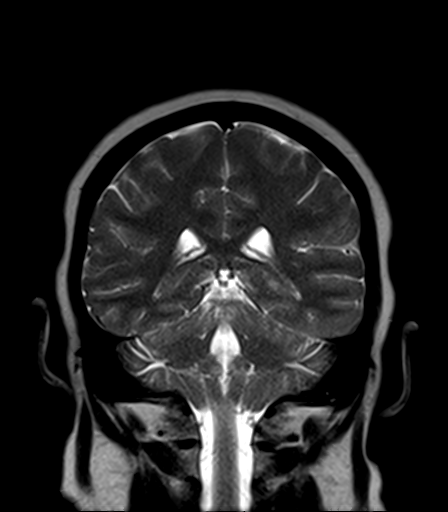
[im 14/31]
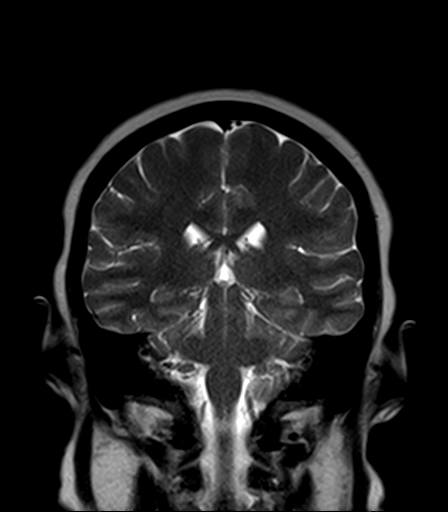
[im 15/31]
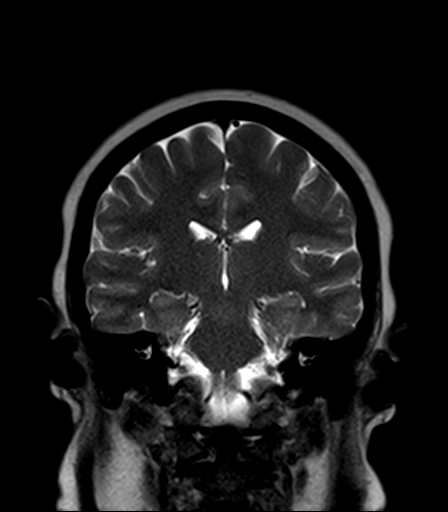
[im 16/31]
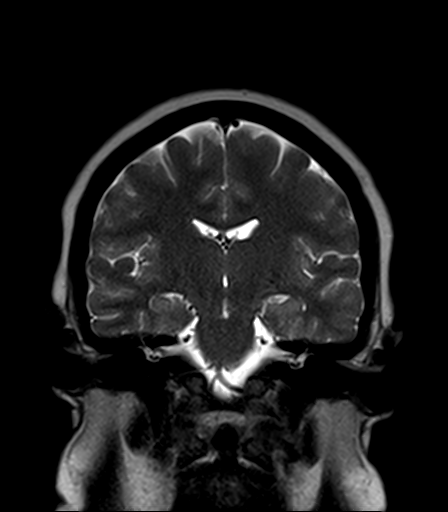
[im 17/31]
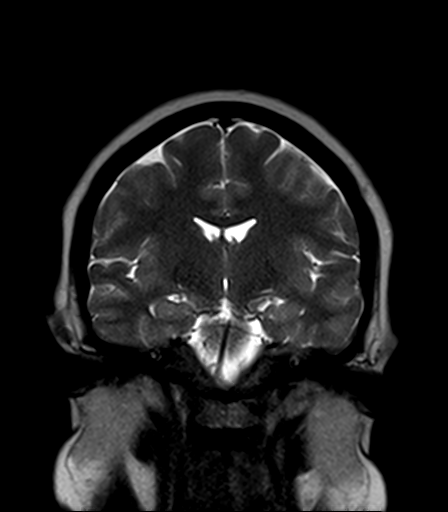
[im 18/31]
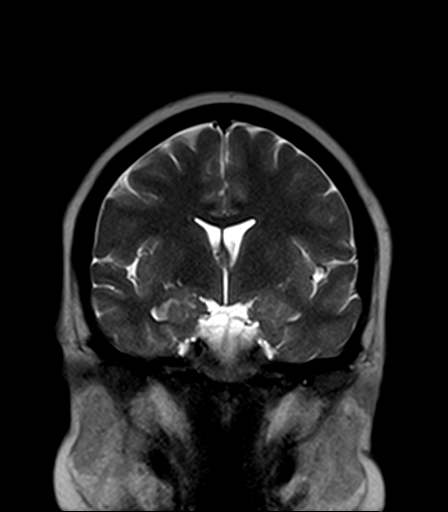
[im 19/31]
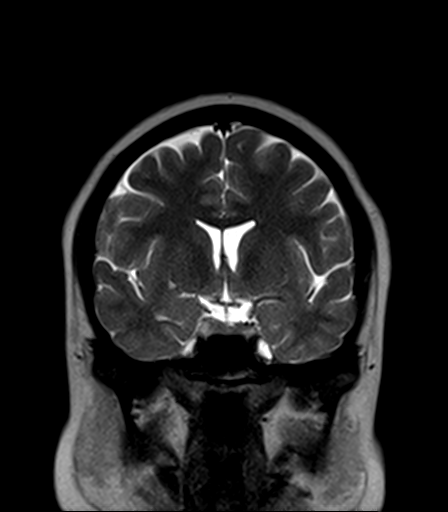
[im 22/31]
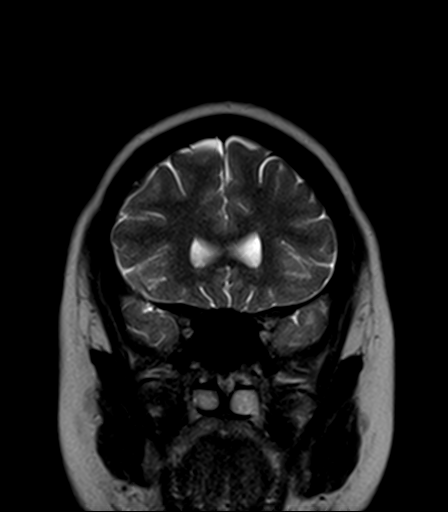
[im 26/31]
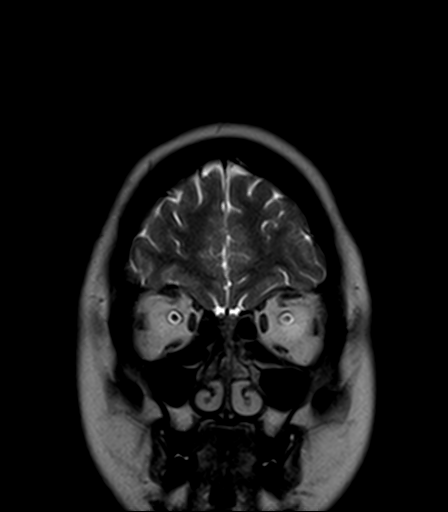
[im 30/31]
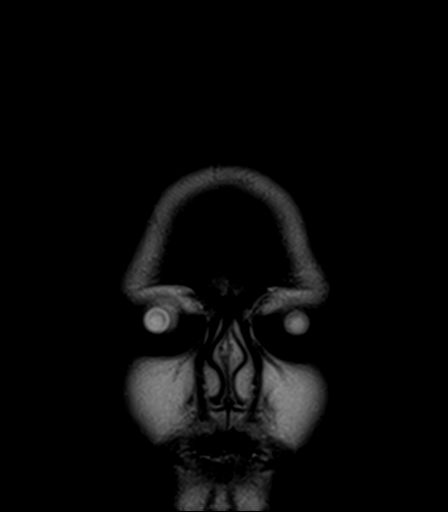

[22 of 31 positions shown; findings below may reference images not displayed]

FINDINGS: Brain:

Cerebral volume is normal.

A 3 mm T2/FLAIR hyperintense focus within the anterior left frontal
lobe periventricular white matter appears new as compared to the
brain MRI of [DATE] (series 16, image 32) (series 15, image 15).
No abnormal enhancement to suggest active demyelination.

A few small scattered foci of T2/FLAIR hyperintense signal
abnormality within the bilateral cerebral white matter are otherwise
unchanged. No posterior fossa white matter lesions are identified.

There is no acute infarct.

No evidence of intracranial mass.

No chronic intracranial blood products.

No extra-axial fluid collection.

No midline shift.

No abnormal intracranial enhancement.

Vascular: Expected proximal arterial flow voids.

Skull and upper cervical spine: No focal marrow lesion

Sinuses/Orbits: Visualized orbits show no acute finding. No
significant paranasal sinus disease at the imaged levels.
IMPRESSION: A 3 mm T2/FLAIR hyperintense focus within the anterior left frontal
lobe periventricular white matter is new from the MRI of [DATE].
A few small scattered foci of T2/FLAIR hyperintense signal
abnormality within the bilateral cerebral white matter are otherwise
stable. These foci are nonspecific, but presumably reflect sequela
of demyelinating disease given the provided history. No abnormal
intracranial enhancement is demonstrated to suggest active
demyelination.

Otherwise normal MRI appearance of the brain.

## 2020-04-09 IMAGING — MR MR THORACIC SPINE WO/W CM
6 of 9 series · 27 of 48 positions shown · IV contrast (gadavist)
Comparison: Same day cervical spine MRI [DATE]. Prior cervical
spine MRI [DATE].

CLINICAL DATA: Relapsing remitting multiple sclerosis. Additional
history provided: Exacerbation with right lower extremity burning
and pins and needle sensation beginning [DATE]. Patient
currently not taking MS meds.

EXAM:
MRI THORACIC WITHOUT AND WITH CONTRAST
TECHNIQUE: Multiplanar and multiecho pulse sequences of the thoracic spine were
obtained without and with intravenous contrast.
CONTRAST:  9mL GADAVIST GADOBUTROL 1 MMOL/ML IV SOLN

[Series 18: T1 · sagittal · 6.0mm · 1.41mm/px · 1 of 9 slices shown (1 of 3)]
[im 1/9]
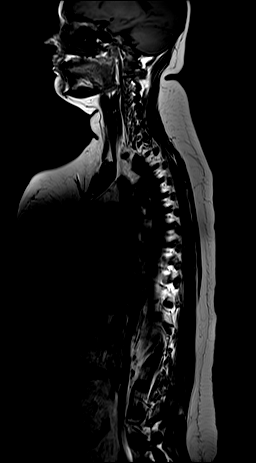

[Series 19: T2 · sagittal · 3.0mm · 1.06mm/px · 3 of 17 slices shown (1 of 2)]
[im 1/17]
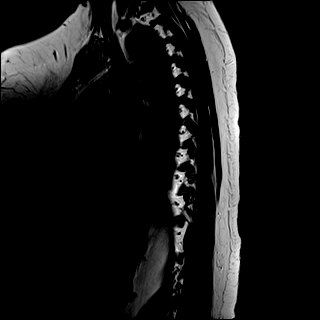
[im 9/17]
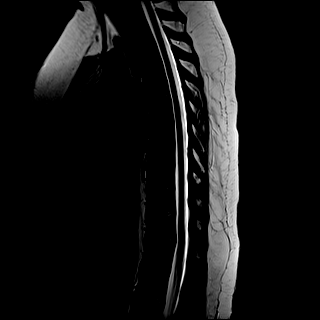
[im 17/17]
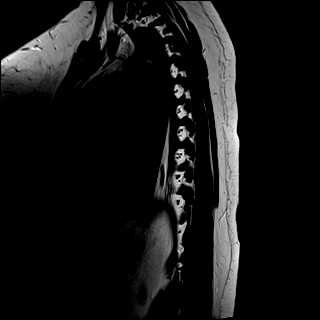

[Series 20: T1 · sagittal · 3.0mm · 1.06mm/px · 4 of 17 slices shown (2 of 3)]
[im 1/17]
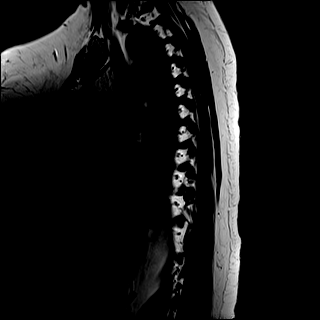
[im 6/17]
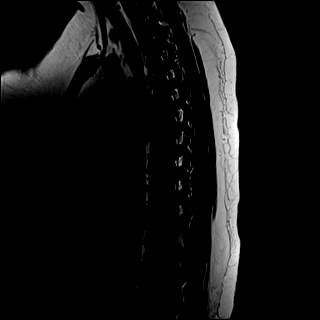
[im 11/17]
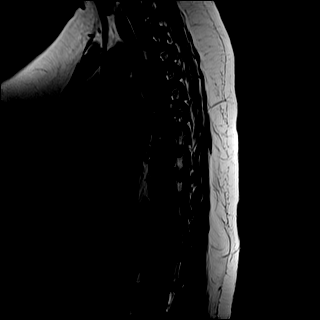
[im 17/17]
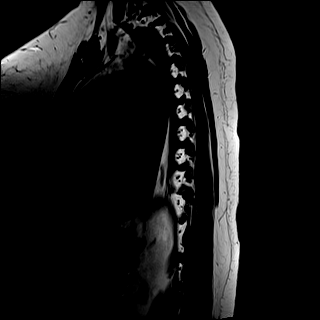

[Series 22: T2 · axial · 4.0mm · 0.59mm/px · z∈[-451,-213]mm · 8 of 39 slices shown (2 of 2)]
[im 1/39]
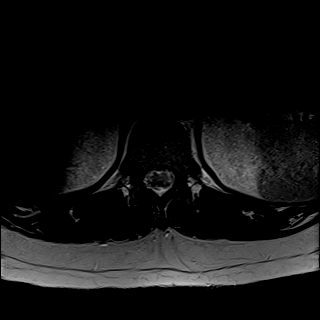
[im 6/39]
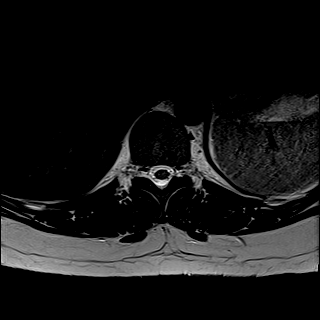
[im 11/39]
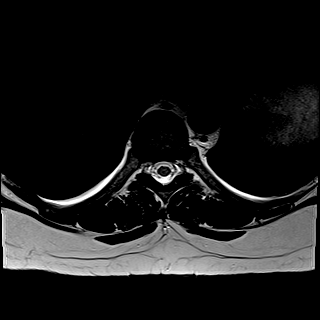
[im 17/39]
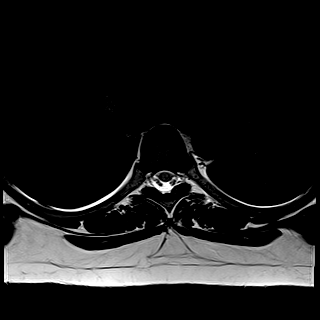
[im 22/39]
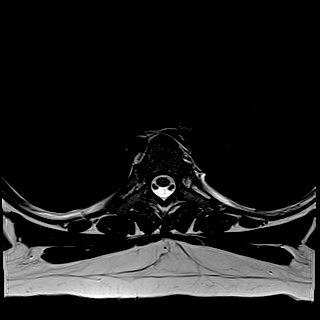
[im 28/39]
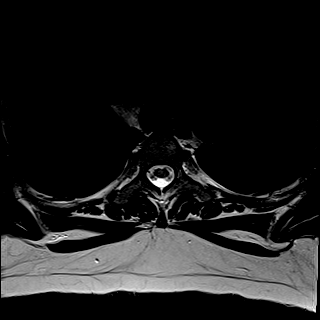
[im 33/39]
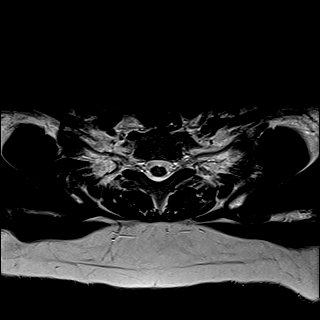
[im 39/39]
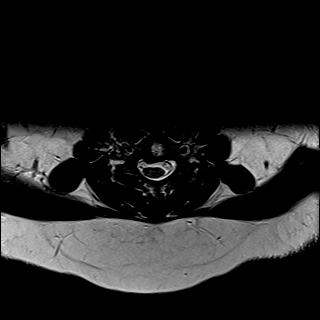

[Series 24: T1 · axial · non-contrast · 4.0mm · 0.31mm/px · z∈[-451,-213]mm · 8 of 39 slices shown (3 of 3)]
[im 1/39]
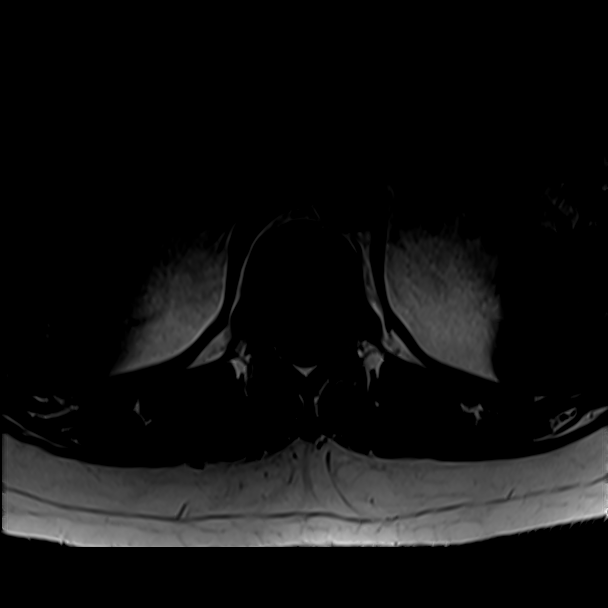
[im 6/39]
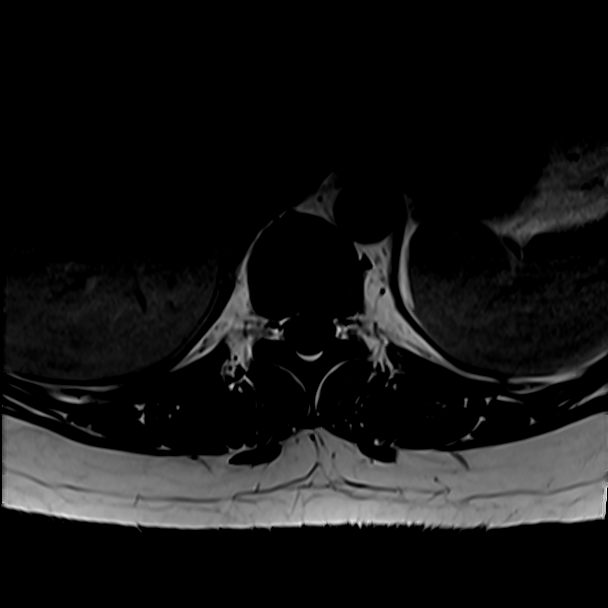
[im 11/39]
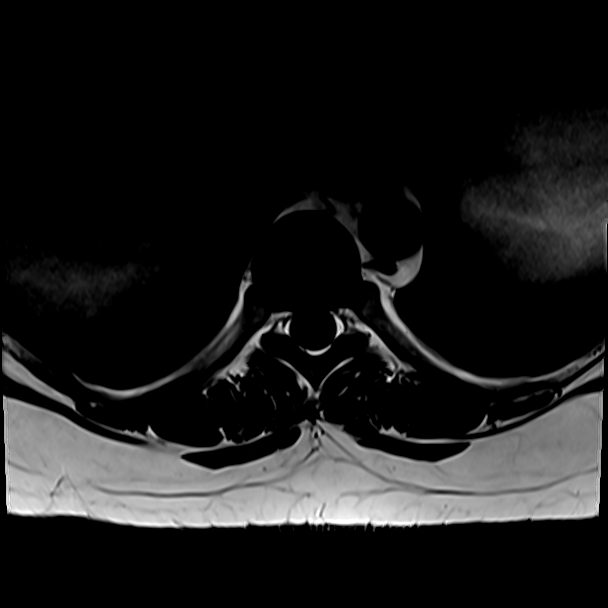
[im 17/39]
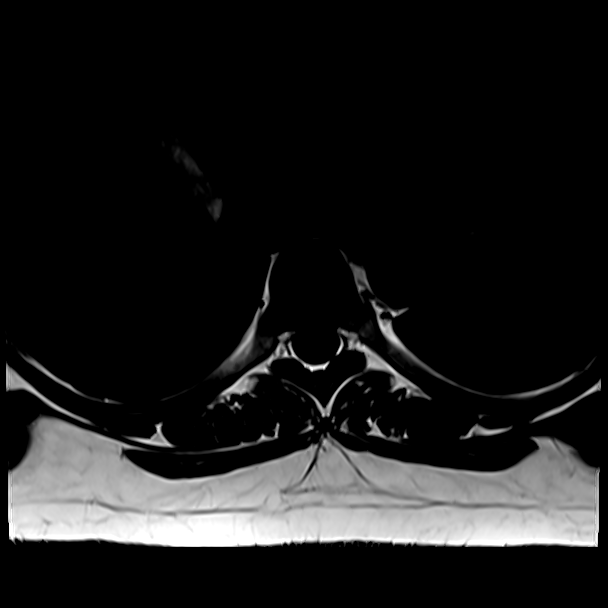
[im 22/39]
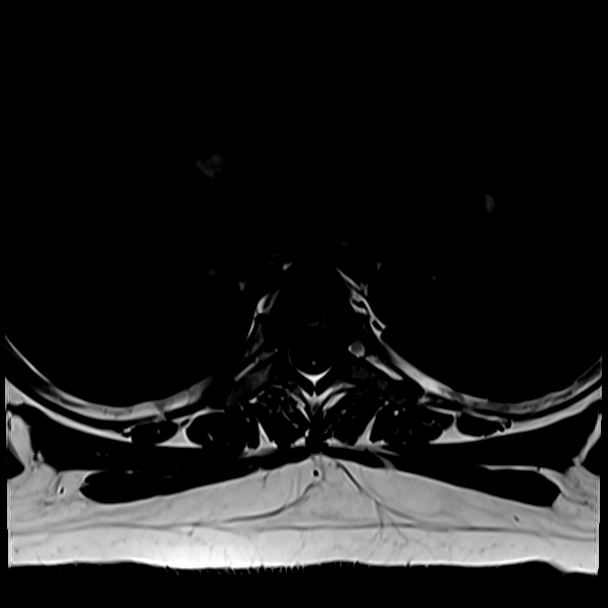
[im 28/39]
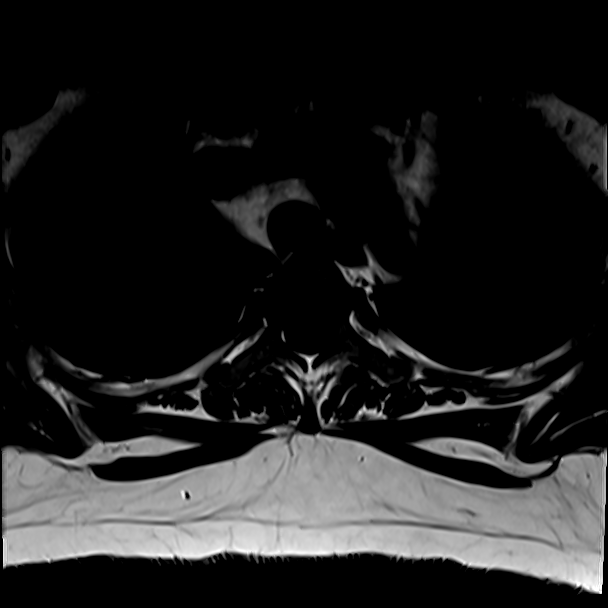
[im 33/39]
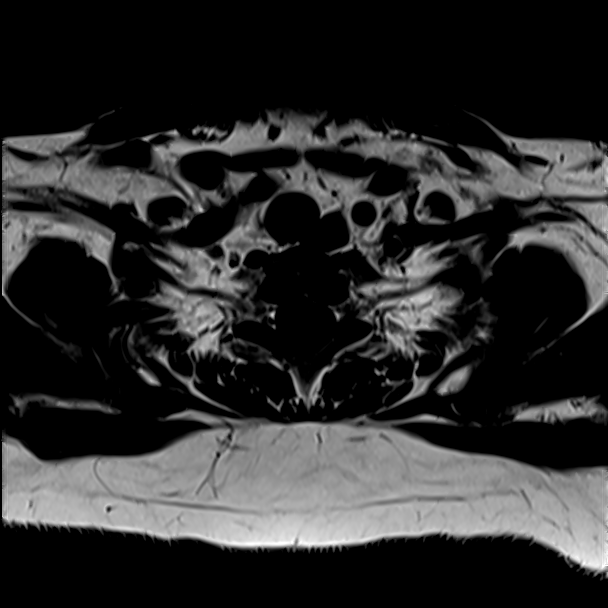
[im 39/39]
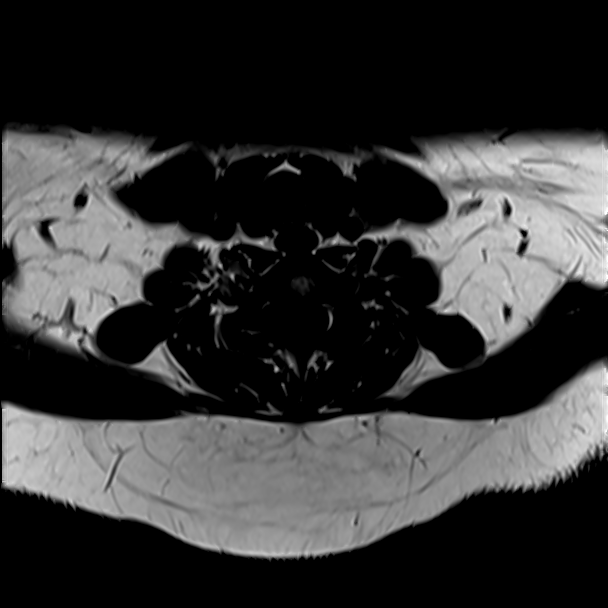

[Series 26: T1 fat-sat post-contrast · sagittal · 3.0mm · 1.06mm/px · 3 of 17 slices shown]
[im 1/17]
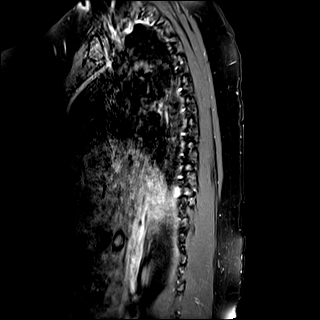
[im 6/17]
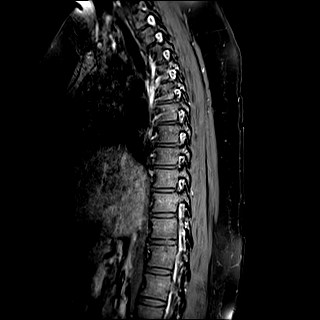
[im 11/17]
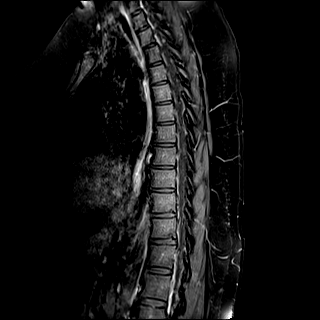

[27 of 48 positions shown; findings below may reference images not displayed]

FINDINGS: Mildly motion degraded exam.

Alignment: No significant spondylolisthesis.

Vertebrae: Vertebral body height is maintained. No significant
marrow edema or focal suspicious osseous lesion. T3 vertebral body
hemangioma. Small T11-T12 Schmorl nodes.

Cord: There is a T2 hyperintense lesion within the dorsal right cord
at the C6-C7 level which is better appreciated on the current exam.
This appears new as compared to the cervical spine MRI of
[DATE]. Within limitations of mild motion degradation, no
definite signal abnormality or abnormal enhancement is identified
within the thoracic spinal cord.

Paraspinal and other soft tissues: No abnormality is identified
within included portions of the thorax or upper
abdomen/retroperitoneum.

Disc levels:

Intervertebral disc height and hydration are maintained throughout
the thoracic spine. Small T11-T12 disc bulge. No significant spinal
canal or foraminal stenosis at any level.
IMPRESSION: T2 hyperintense lesion within the dorsal right cord at the C6-C7
level, which appears new from the cervical spine MRI of [DATE].

Within the limitations of mild motion degradation, no definite
signal abnormality or abnormal enhancement is identified within the
thoracic spinal cord.

Minimal thoracic spondylosis. No significant spinal canal or
foraminal narrowing at any level.

## 2020-04-09 IMAGING — MR MR CERVICAL SPINE WO/W CM
5 of 8 series · 25 of 48 positions shown · IV contrast (gadavist)
Comparison: MRI of the cervical spine [DATE].
COMPARISON: MRI of the cervical spine [DATE].

Addendum:
CLINICAL DATA: Relapsing remitting multiple sclerosis. Additional
history provided: Exacerbation with right lower extremity burning
and pins and needles sensation beginning [DATE], currently off
of multiple sclerosis meds.

EXAM:
MRI CERVICAL SPINE WITHOUT AND WITH CONTRAST
TECHNIQUE: Multiplanar and multiecho pulse sequences of the cervical spine, to
include the craniocervical junction and cervicothoracic junction,
were obtained without and with intravenous contrast.
CONTRAST:  9mL GADAVIST GADOBUTROL 1 MMOL/ML IV SOLN

[Series 1: T2 · sagittal · 3.0mm · 0.62mm/px · 4 of 15 slices shown (1 of 2)]
[im 1/15]
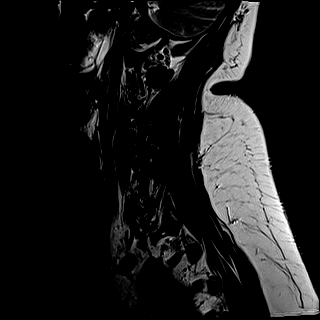
[im 5/15]
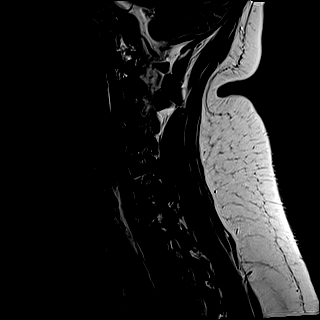
[im 10/15]
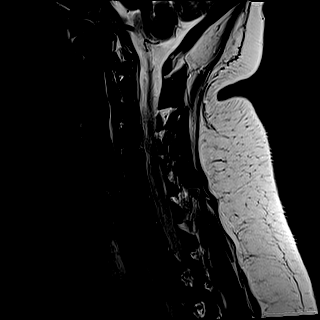
[im 15/15]
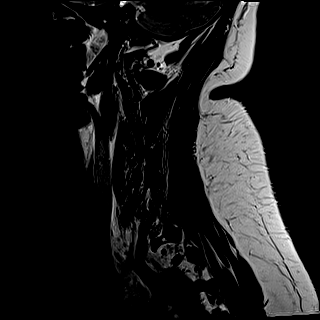

[Series 3: STIR · sagittal · 3.0mm · 0.62mm/px · 4 of 15 slices shown]
[im 1/15]
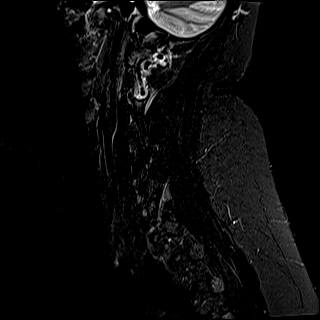
[im 5/15]
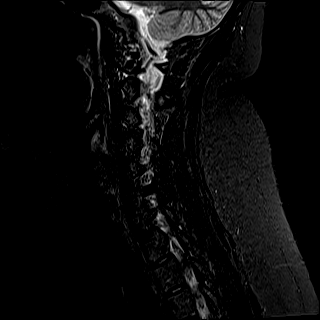
[im 10/15]
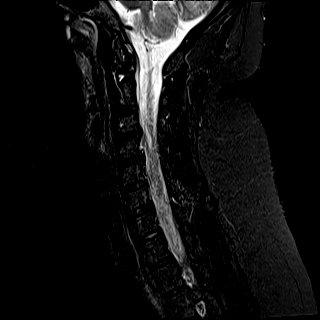
[im 15/15]
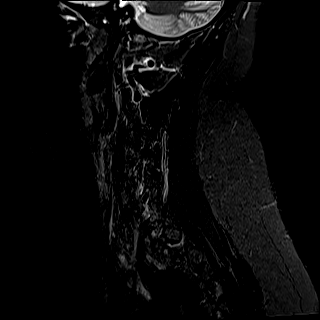

[Series 4: T2 · axial · 3.0mm · 0.70mm/px · z∈[-227,-129]mm · 8 of 29 slices shown (2 of 2)]
[im 1/29]
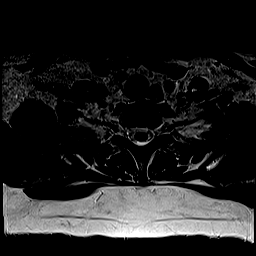
[im 5/29]
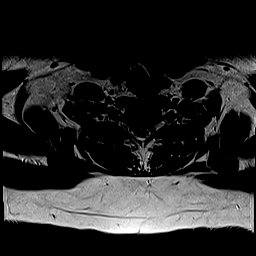
[im 9/29]
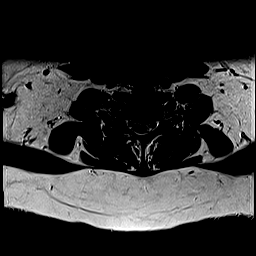
[im 13/29]
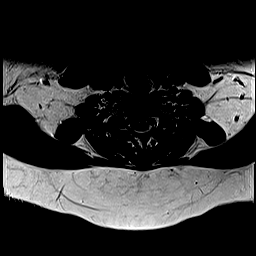
[im 17/29]
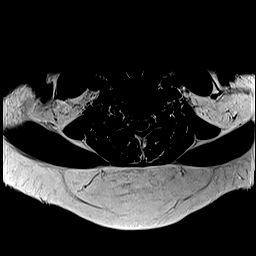
[im 21/29]
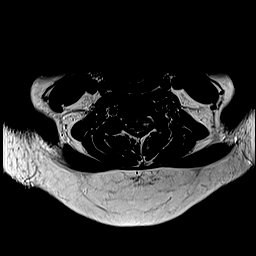
[im 25/29]
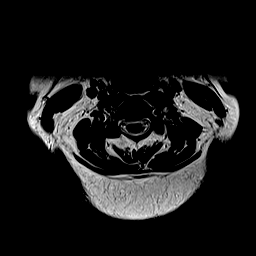
[im 29/29]
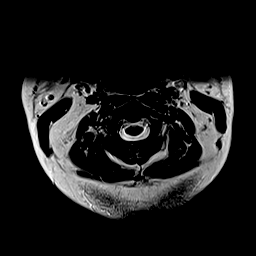

[Series 6: T1 · axial · non-contrast · 3.0mm · 0.35mm/px · z∈[-227,-129]mm · 8 of 29 slices shown]
[im 1/29]
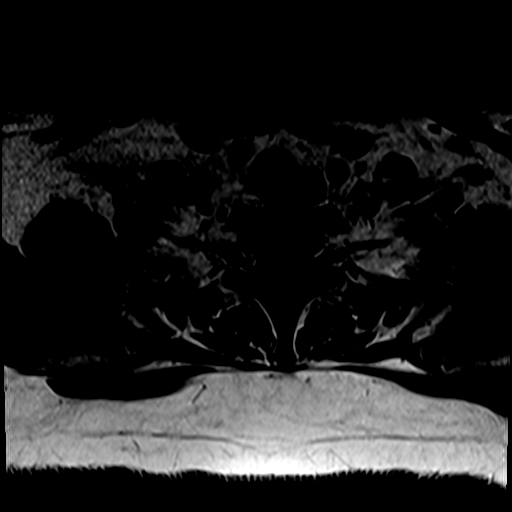
[im 5/29]
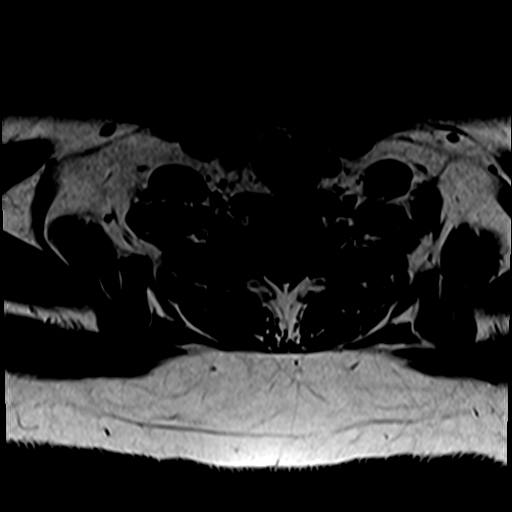
[im 9/29]
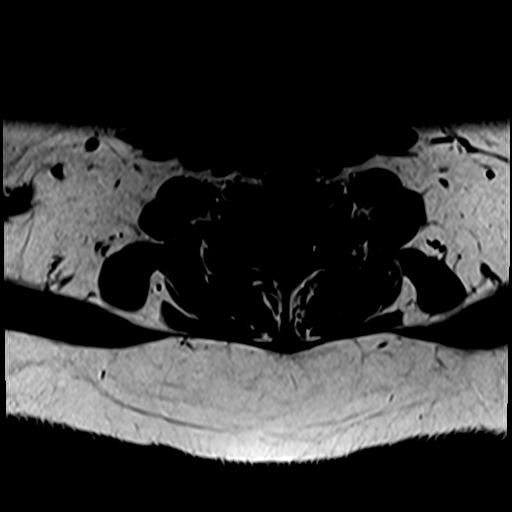
[im 13/29]
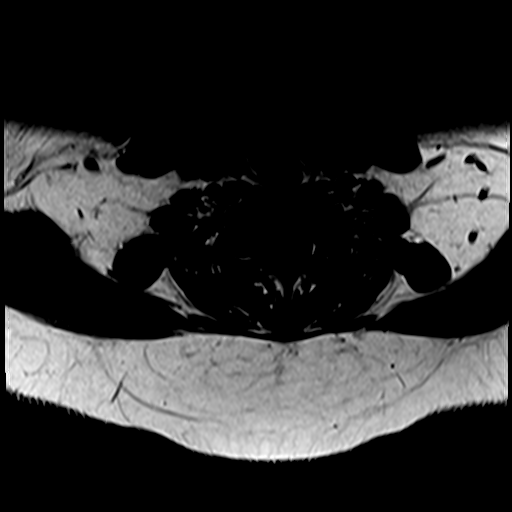
[im 17/29]
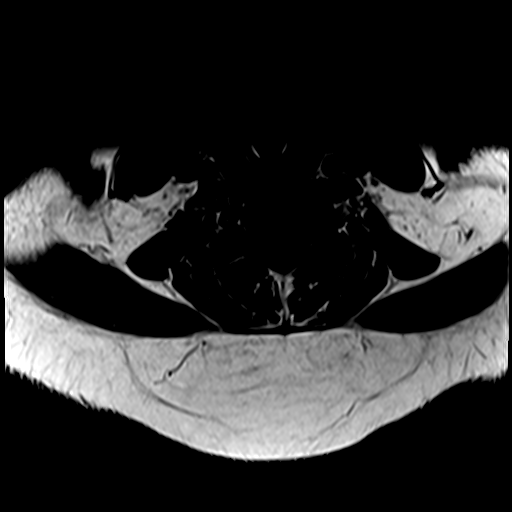
[im 21/29]
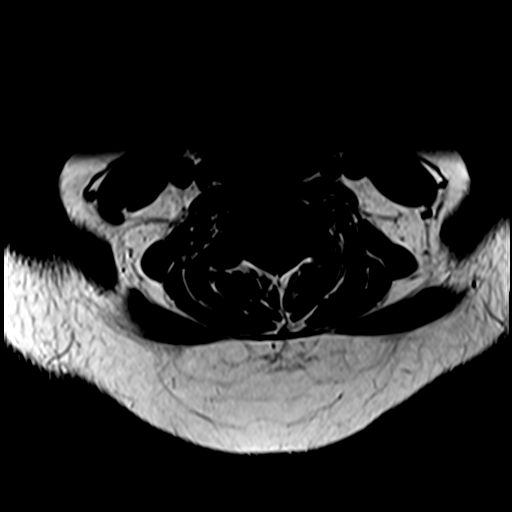
[im 25/29]
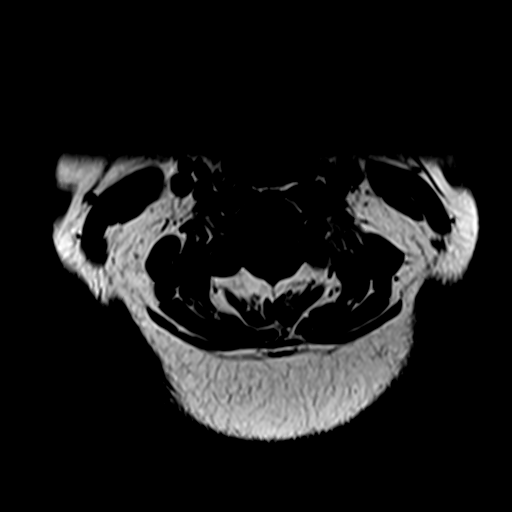
[im 29/29]
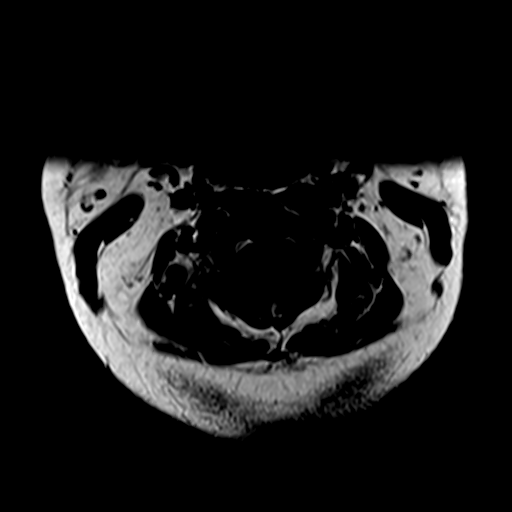

[Series 12: T1 post-contrast · axial · 3.0mm · 0.35mm/px · 1 of 29 slices shown]
[im 1/29]
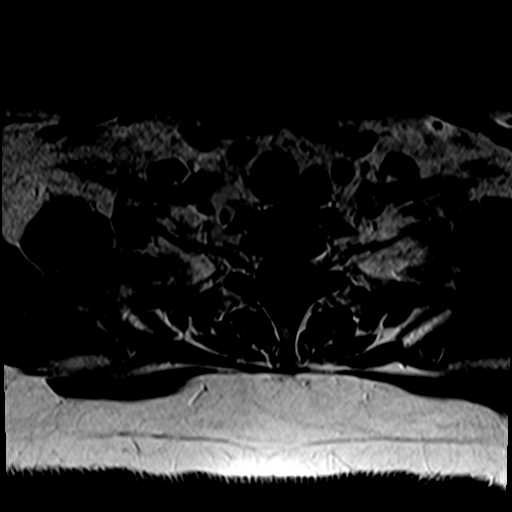

[25 of 48 positions shown; findings below may reference images not displayed]

FINDINGS: Alignment: Straightening of the expected cervical lordosis. No
significant spondylolisthesis.

Vertebrae: Vertebral body height is maintained. No significant
marrow edema or focal suspicious osseous lesion. Redemonstrated
vertebral body hemangiomas within the C6 and T3 vertebrae.

Cord: 6 mm focus of T2/STIR hyperintense signal abnormality within
the ventral cord at the C2 level, new from the prior MRI of
[DATE] (for instance as seen on series 1, image 8) (series 3,
image 8). A known small lesion within the left dorsal cord at the C3
level was better appreciated on the prior MRI. There is a new
T2/STIR hyperintense lesion within the right posterior cord at the
C4 level (for instance as seen on series 4, images 9 and 10). There
is a new T2/STIR hyperintense lesion within the left aspect of the
cord at the C4 level (series 4, image 9) (series 5, image 9). Subtle
enhancement is also present at this site (series 11, image 10)
(series 12, image 9). Redemonstrated T2/STIR hyperintense foci
within the right and left hemi-cords at the C5 level. Redemonstrated
T2/STIR hyperintense signal abnormality within the left hemicord at
the C6-C7 level.

Posterior Fossa, vertebral arteries, paraspinal tissues: Posterior
fossa better assessed on the concurrently performed brain MRI. Flow
voids preserved within the imaged cervical vertebral arteries.
Paraspinal soft tissues within normal limits. 13 mm right thyroid
lobe nodule (series 3, image 3).

Disc levels:

Unless otherwise stated, the level by level findings below have not
significantly changed since prior MRI [DATE].

No more than mild disc degeneration at any level.

C2-C3: No significant disc herniation or stenosis.

C3-C4: Mild disc bulge and uncovertebral hypertrophy. No significant
spinal canal or foraminal stenosis.

C4-C5: Shallow disc bulge asymmetric to the left. Mild uncovertebral
hypertrophy on the left. No significant spinal canal stenosis or
neural foraminal narrowing.

C5-C6: Small right center disc protrusion. Partial effacement of the
ventral thecal sac with mild relative spinal canal narrowing. No
significant foraminal stenosis.

C6-C7: No significant disc herniation or stenosis.

C7-T1: No significant disc herniation or stenosis.
IMPRESSION: There are T2/STIR hyperintense lesions within the ventral spinal
cord at the C2 level, within the dorsal right cord at the C4 level
and within the left aspect of the cord at the C4 level which are new
from the MRI of [DATE]. There is subtle corresponding
enhancement within the left cord at the C4 level and this likely
reflects a site of active/recent demyelination.

Multiple cervical spinal cord lesions are otherwise stable.

Stable cervical spondylosis. No more than mild relative spinal canal
narrowing. No significant foraminal stenosis.

Incidentally noted 13 mm right thyroid lobe nodule. A nonemergent
thyroid ultrasound is recommended for further evaluation.

ADDENDUM:
Impression #1 will be called to the ordering clinician or
representative by the Radiologist Assistant, and communication
documented in the PACS or [REDACTED].

*** End of Addendum ***
FINDINGS: Alignment: Straightening of the expected cervical lordosis. No
significant spondylolisthesis.

Vertebrae: Vertebral body height is maintained. No significant
marrow edema or focal suspicious osseous lesion. Redemonstrated
vertebral body hemangiomas within the C6 and T3 vertebrae.

Cord: 6 mm focus of T2/STIR hyperintense signal abnormality within
the ventral cord at the C2 level, new from the prior MRI of
[DATE] (for instance as seen on series 1, image 8) (series 3,
image 8). A known small lesion within the left dorsal cord at the C3
level was better appreciated on the prior MRI. There is a new
T2/STIR hyperintense lesion within the right posterior cord at the
C4 level (for instance as seen on series 4, images 9 and 10). There
is a new T2/STIR hyperintense lesion within the left aspect of the
cord at the C4 level (series 4, image 9) (series 5, image 9). Subtle
enhancement is also present at this site (series 11, image 10)
(series 12, image 9). Redemonstrated T2/STIR hyperintense foci
within the right and left hemi-cords at the C5 level. Redemonstrated
T2/STIR hyperintense signal abnormality within the left hemicord at
the C6-C7 level.

Posterior Fossa, vertebral arteries, paraspinal tissues: Posterior
fossa better assessed on the concurrently performed brain MRI. Flow
voids preserved within the imaged cervical vertebral arteries.
Paraspinal soft tissues within normal limits. 13 mm right thyroid
lobe nodule (series 3, image 3).

Disc levels:

Unless otherwise stated, the level by level findings below have not
significantly changed since prior MRI [DATE].

No more than mild disc degeneration at any level.

C2-C3: No significant disc herniation or stenosis.

C3-C4: Mild disc bulge and uncovertebral hypertrophy. No significant
spinal canal or foraminal stenosis.

C4-C5: Shallow disc bulge asymmetric to the left. Mild uncovertebral
hypertrophy on the left. No significant spinal canal stenosis or
neural foraminal narrowing.

C5-C6: Small right center disc protrusion. Partial effacement of the
ventral thecal sac with mild relative spinal canal narrowing. No
significant foraminal stenosis.

C6-C7: No significant disc herniation or stenosis.

C7-T1: No significant disc herniation or stenosis.
IMPRESSION: There are T2/STIR hyperintense lesions within the ventral spinal
cord at the C2 level, within the dorsal right cord at the C4 level
and within the left aspect of the cord at the C4 level which are new
from the MRI of [DATE]. There is subtle corresponding
enhancement within the left cord at the C4 level and this likely
reflects a site of active/recent demyelination.

Multiple cervical spinal cord lesions are otherwise stable.

Stable cervical spondylosis. No more than mild relative spinal canal
narrowing. No significant foraminal stenosis.

Incidentally noted 13 mm right thyroid lobe nodule. A nonemergent
thyroid ultrasound is recommended for further evaluation.

## 2020-04-09 IMAGING — MR MR HEAD WO/W CM
15 series · 48 of 48 positions shown · IV contrast (gadavist)
Comparison: Brain MRI [DATE].

CLINICAL DATA: Relapsing remitting multiple sclerosis. Additional
history provided: Exacerbation with right lower extremity burning
and pins/needles sensation starting [DATE], currently off of MS
meds.

EXAM:
MRI HEAD WITHOUT AND WITH CONTRAST
TECHNIQUE: Multiplanar, multiecho pulse sequences of the brain and surrounding
structures were obtained without and with intravenous contrast.
CONTRAST:  9mL GADAVIST GADOBUTROL 1 MMOL/ML IV SOLN

[Series 5: ax dwi_tracew · axial · 3.0mm · 0.60mm/px · z∈[-92,+63]mm · 5 of 96 slices shown]
[im 1/96]
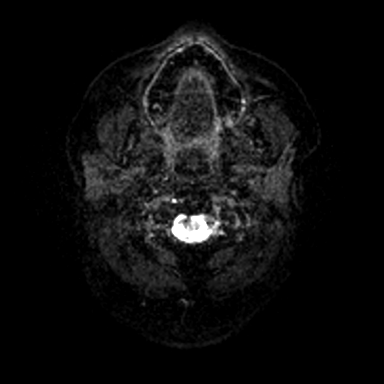
[im 24/96]
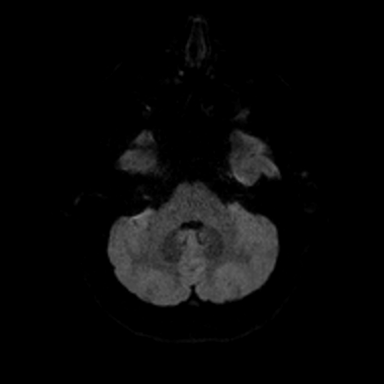
[im 48/96]
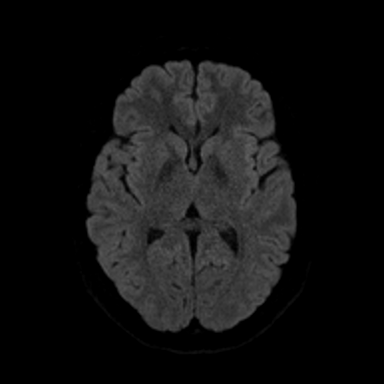
[im 72/96]
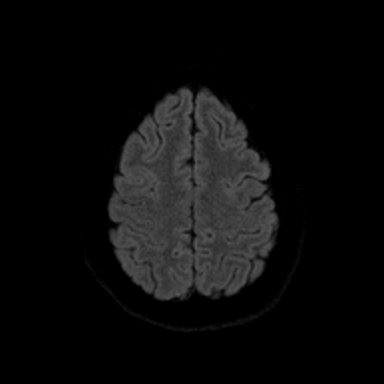
[im 96/96]
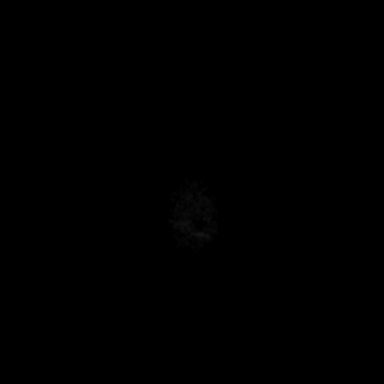

[Series 6: ax dwi_adc · axial · 3.0mm · 0.60mm/px · z∈[-92,+63]mm · 2 of 48 slices shown]
[im 1/48]
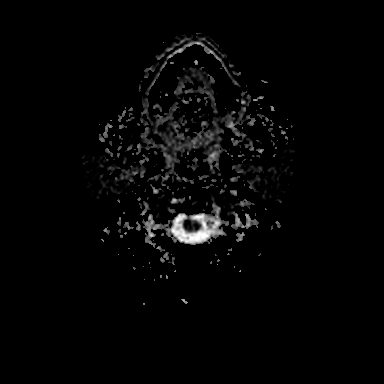
[im 48/48]
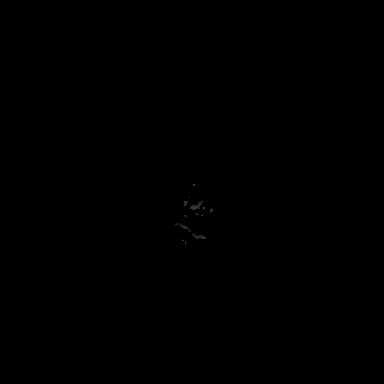

[Series 7: cor dwi_tracew · coronal · 5.0mm · 0.68mm/px · 4 of 80 slices shown]
[im 1/80]
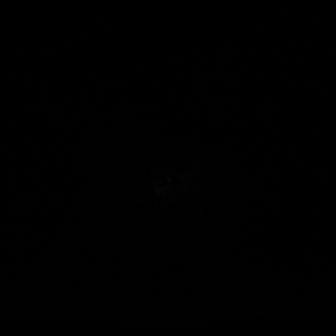
[im 27/80]
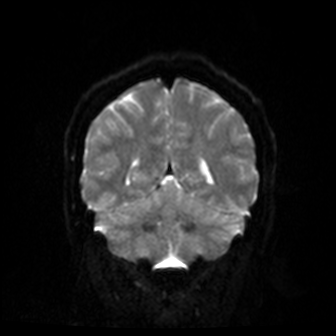
[im 53/80]
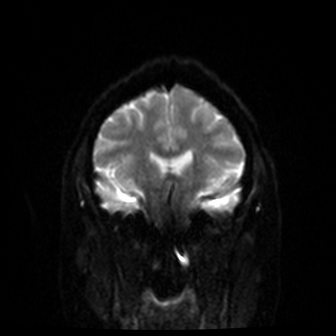
[im 80/80]
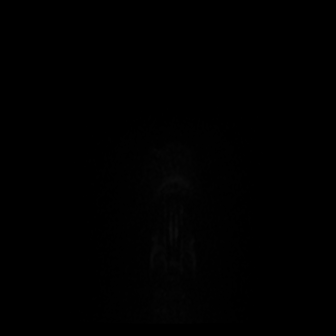

[Series 8: cor dwi_adc · coronal · 5.0mm · 0.68mm/px · 2 of 39 slices shown]
[im 1/39]
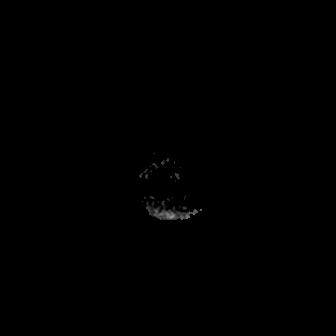
[im 39/39]
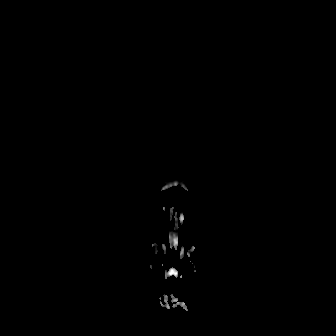

[Series 9: T1 · sagittal · 5.0mm · 0.62mm/px · 1 of 25 slices shown (1 of 2)]
[im 1/25]
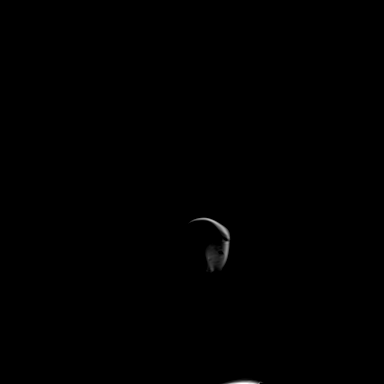

[Series 10: T2 · axial · 5.0mm · 0.53mm/px · 1 of 25 slices shown]
[im 1/25]
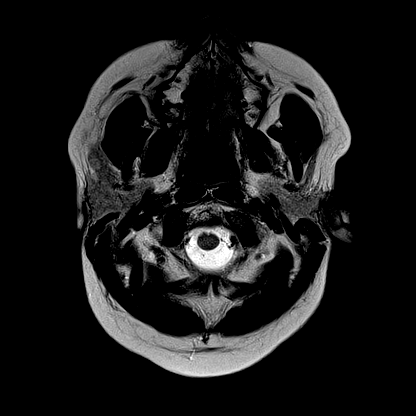

[Series 11: mag_images · axial · 3.0mm · 0.90mm/px · z∈[-102,+75]mm · 3 of 60 slices shown]
[im 1/60]
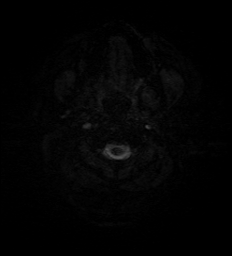
[im 30/60]
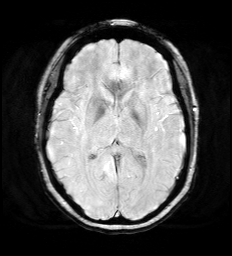
[im 60/60]
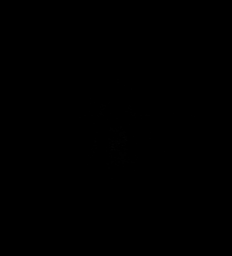

[Series 12: pha_images · axial · 3.0mm · 0.90mm/px · z∈[-102,+75]mm · 3 of 60 slices shown]
[im 1/60]
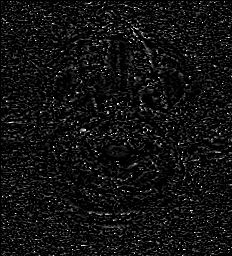
[im 30/60]
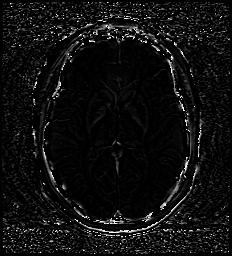
[im 60/60]
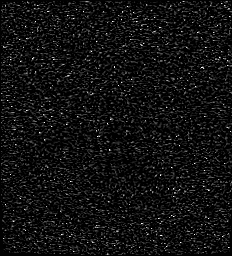

[Series 13: swi_images · axial · 3.0mm · 0.90mm/px · z∈[-102,+75]mm · 3 of 60 slices shown]
[im 1/60]
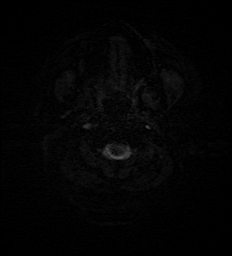
[im 30/60]
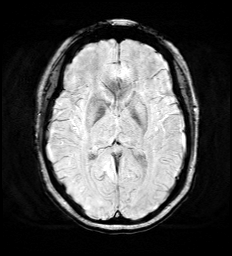
[im 60/60]
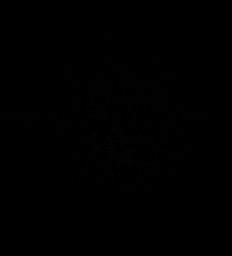

[Series 14: mip_images(sw) · axial · 24.0mm · 0.90mm/px · z∈[-91,+64]mm · 3 of 53 slices shown]
[im 1/53]
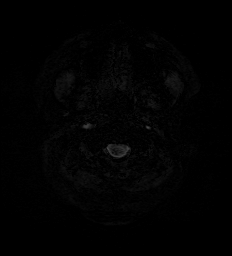
[im 27/53]
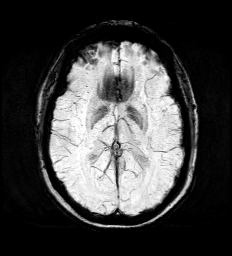
[im 53/53]
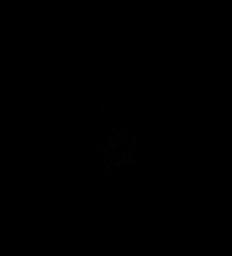

[Series 15: FLAIR · sagittal · 5.0mm · 0.94mm/px · 1 of 25 slices shown (1 of 2)]
[im 1/25]
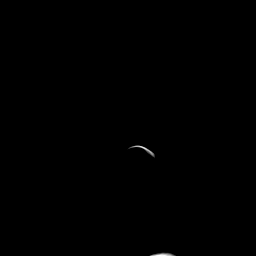

[Series 16: FLAIR · axial · 3.0mm · 0.53mm/px · z∈[-95,+67]mm · 3 of 55 slices shown (2 of 2)]
[im 1/55]
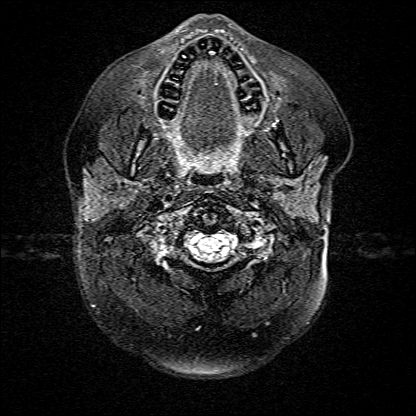
[im 28/55]
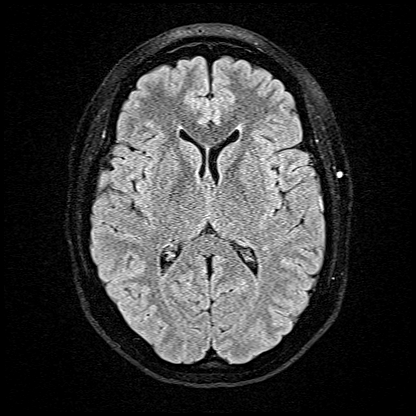
[im 55/55]
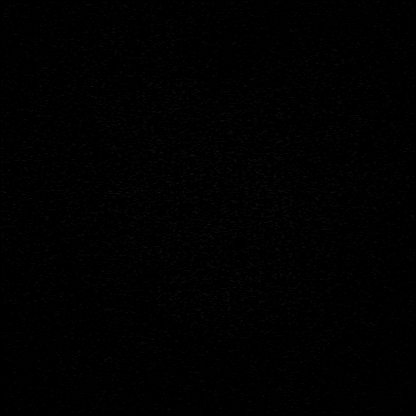

[Series 17: T1 · axial · 1.0mm · 0.98mm/px · z∈[-101,+74]mm · 8 of 176 slices shown (2 of 2)]
[im 1/176]
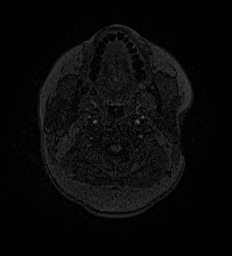
[im 26/176]
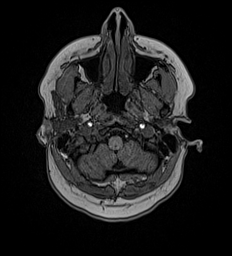
[im 51/176]
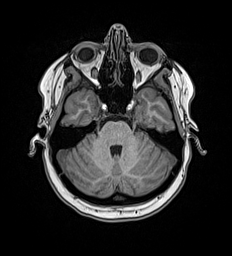
[im 76/176]
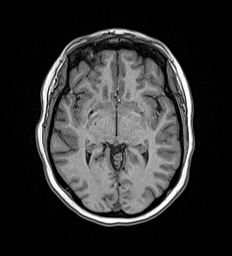
[im 101/176]
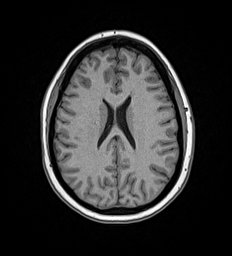
[im 126/176]
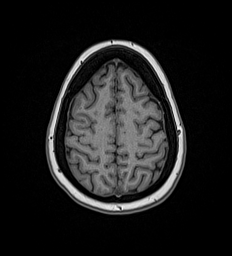
[im 151/176]
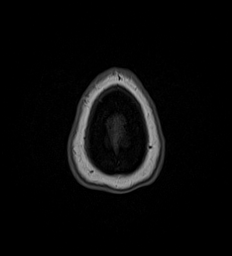
[im 176/176]
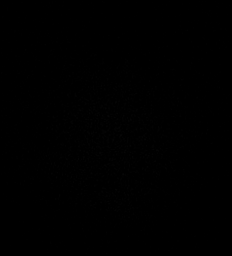

[Series 18: T1 post-contrast · axial · 1.0mm · 0.98mm/px · z∈[-101,+74]mm · 8 of 175 slices shown (1 of 2)]
[im 1/175]
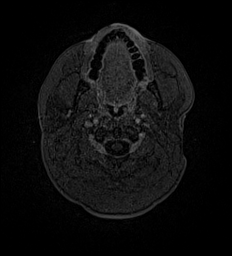
[im 25/175]
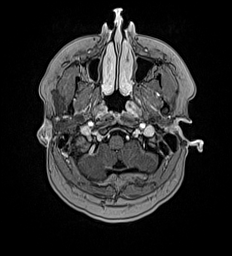
[im 50/175]
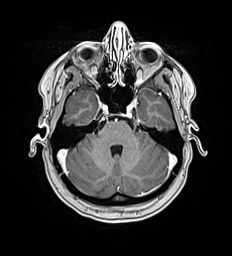
[im 75/175]
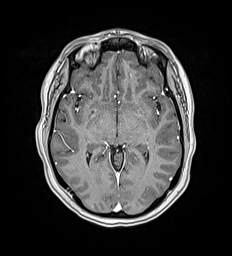
[im 100/175]
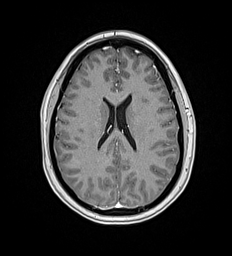
[im 125/175]
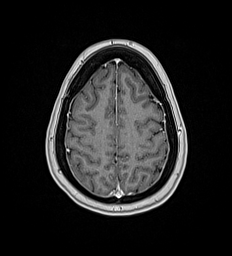
[im 150/175]
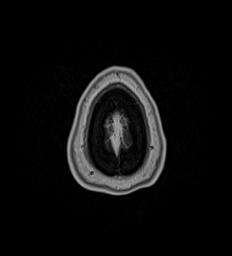
[im 175/175]
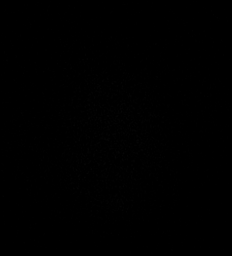

[Series 19: T1 post-contrast · coronal · 5.0mm · 0.57mm/px · 1 of 29 slices shown (2 of 2)]
[im 1/29]
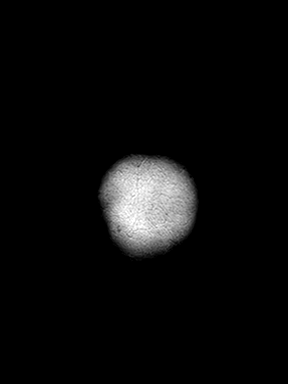

[48 of 48 positions shown; findings below may reference images not displayed]

FINDINGS: Brain:

Cerebral volume is normal.

A 3 mm T2/FLAIR hyperintense focus within the anterior left frontal
lobe periventricular white matter appears new as compared to the
brain MRI of [DATE] (series 16, image 32) (series 15, image 15).
No abnormal enhancement to suggest active demyelination.

A few small scattered foci of T2/FLAIR hyperintense signal
abnormality within the bilateral cerebral white matter are otherwise
unchanged. No posterior fossa white matter lesions are identified.

There is no acute infarct.

No evidence of intracranial mass.

No chronic intracranial blood products.

No extra-axial fluid collection.

No midline shift.

No abnormal intracranial enhancement.

Vascular: Expected proximal arterial flow voids.

Skull and upper cervical spine: No focal marrow lesion

Sinuses/Orbits: Visualized orbits show no acute finding. No
significant paranasal sinus disease at the imaged levels.
IMPRESSION: A 3 mm T2/FLAIR hyperintense focus within the anterior left frontal
lobe periventricular white matter is new from the MRI of [DATE].
A few small scattered foci of T2/FLAIR hyperintense signal
abnormality within the bilateral cerebral white matter are otherwise
stable. These foci are nonspecific, but presumably reflect sequela
of demyelinating disease given the provided history. No abnormal
intracranial enhancement is demonstrated to suggest active
demyelination.

Otherwise normal MRI appearance of the brain.

## 2020-04-09 MED ORDER — GADOBUTROL 1 MMOL/ML IV SOLN
9.0000 mL | Freq: Once | INTRAVENOUS | Status: AC | PRN
  Administered 2020-04-09: 9 mL via INTRAVENOUS

## 2020-04-17 ENCOUNTER — Encounter: Payer: Self-pay | Admitting: Family Medicine

## 2020-04-17 ENCOUNTER — Inpatient Hospital Stay: Payer: BC Managed Care – PPO

## 2020-04-17 ENCOUNTER — Inpatient Hospital Stay
Admission: EM | Admit: 2020-04-17 | Discharge: 2020-04-19 | DRG: 060 | Disposition: A | Discharge status: Home / Self Care | Payer: BC Managed Care – PPO | Attending: Internal Medicine | Admitting: Internal Medicine

## 2020-04-17 ENCOUNTER — Other Ambulatory Visit: Payer: Self-pay

## 2020-04-17 DIAGNOSIS — Y929 Unspecified place or not applicable: Secondary | ICD-10-CM

## 2020-04-17 DIAGNOSIS — R29898 Other symptoms and signs involving the musculoskeletal system: Secondary | ICD-10-CM | POA: Diagnosis present

## 2020-04-17 DIAGNOSIS — T50905A Adverse effect of unspecified drugs, medicaments and biological substances, initial encounter: Secondary | ICD-10-CM | POA: Diagnosis not present

## 2020-04-17 DIAGNOSIS — Z820 Family history of epilepsy and other diseases of the nervous system: Secondary | ICD-10-CM | POA: Diagnosis not present

## 2020-04-17 DIAGNOSIS — F32A Depression, unspecified: Secondary | ICD-10-CM | POA: Diagnosis present

## 2020-04-17 DIAGNOSIS — G35 Multiple sclerosis: Secondary | ICD-10-CM | POA: Diagnosis present

## 2020-04-17 DIAGNOSIS — Z20822 Contact with and (suspected) exposure to covid-19: Secondary | ICD-10-CM | POA: Diagnosis present

## 2020-04-17 DIAGNOSIS — T380X5A Adverse effect of glucocorticoids and synthetic analogues, initial encounter: Secondary | ICD-10-CM | POA: Diagnosis present

## 2020-04-17 DIAGNOSIS — R739 Hyperglycemia, unspecified: Secondary | ICD-10-CM | POA: Diagnosis present

## 2020-04-17 DIAGNOSIS — E876 Hypokalemia: Secondary | ICD-10-CM | POA: Diagnosis present

## 2020-04-17 DIAGNOSIS — Z79899 Other long term (current) drug therapy: Secondary | ICD-10-CM

## 2020-04-17 LAB — CBC WITH DIFFERENTIAL/PLATELET
Abs Immature Granulocytes: 0.04 10*3/uL (ref 0.00–0.07)
Basophils Absolute: 0 10*3/uL (ref 0.0–0.1)
Basophils Relative: 0 %
Eosinophils Absolute: 0 10*3/uL (ref 0.0–0.5)
Eosinophils Relative: 0 %
HCT: 41.9 % (ref 36.0–46.0)
Hemoglobin: 13.5 g/dL (ref 12.0–15.0)
Immature Granulocytes: 0 %
Lymphocytes Relative: 9 %
Lymphs Abs: 0.9 10*3/uL (ref 0.7–4.0)
MCH: 30.5 pg (ref 26.0–34.0)
MCHC: 32.2 g/dL (ref 30.0–36.0)
MCV: 94.6 fL (ref 80.0–100.0)
Monocytes Absolute: 0.1 10*3/uL (ref 0.1–1.0)
Monocytes Relative: 1 %
Neutro Abs: 8.5 10*3/uL — ABNORMAL HIGH (ref 1.7–7.7)
Neutrophils Relative %: 90 %
Platelets: 235 10*3/uL (ref 150–400)
RBC: 4.43 MIL/uL (ref 3.87–5.11)
RDW: 14.2 % (ref 11.5–15.5)
WBC: 9.5 10*3/uL (ref 4.0–10.5)
nRBC: 0 % (ref 0.0–0.2)

## 2020-04-17 LAB — COMPREHENSIVE METABOLIC PANEL
ALT: 21 U/L (ref 0–44)
AST: 36 U/L (ref 15–41)
Albumin: 4.2 g/dL (ref 3.5–5.0)
Alkaline Phosphatase: 84 U/L (ref 38–126)
Anion gap: 11 (ref 5–15)
BUN: 10 mg/dL (ref 6–20)
CO2: 18 mmol/L — ABNORMAL LOW (ref 22–32)
Calcium: 9.3 mg/dL (ref 8.9–10.3)
Chloride: 108 mmol/L (ref 98–111)
Creatinine, Ser: 0.82 mg/dL (ref 0.44–1.00)
GFR, Estimated: >60 mL/min (ref >60)
Glucose, Bld: 262 mg/dL — ABNORMAL HIGH (ref 70–99)
Potassium: 2.5 mmol/L — CL (ref 3.5–5.1)
Sodium: 137 mmol/L (ref 135–145)
Total Bilirubin: 0.6 mg/dL (ref 0.3–1.2)
Total Protein: 8 g/dL (ref 6.5–8.1)

## 2020-04-17 LAB — PREGNANCY, URINE: Preg Test, Ur: NEGATIVE

## 2020-04-17 IMAGING — MR MR CERVICAL SPINE WO/W CM
5 of 8 series · 29 of 48 positions shown · IV contrast (gadavist)
Comparison: [DATE]

CLINICAL DATA: Upper and lower extremity weakness

EXAM:
MRI HEAD WITHOUT AND WITH CONTRAST
MRI CERVICAL SPINE WITHOUT AND WITH CONTRAST
TECHNIQUE: Multiplanar, multiecho pulse sequences of the brain and surrounding
structures, and cervical spine, to include the craniocervical
junction and cervicothoracic junction, were obtained without and
with intravenous contrast.
CONTRAST:  9mL GADAVIST GADOBUTROL 1 MMOL/ML IV SOLN

[Series 23: T2 · sagittal · 3.0mm · 0.62mm/px · 4 of 15 slices shown (1 of 2)]
[im 1/15]
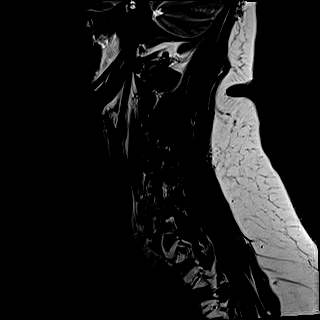
[im 5/15]
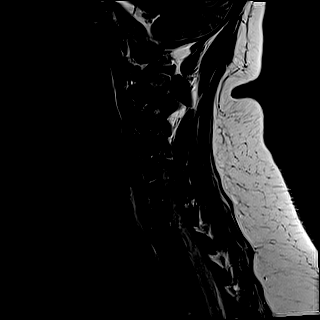
[im 10/15]
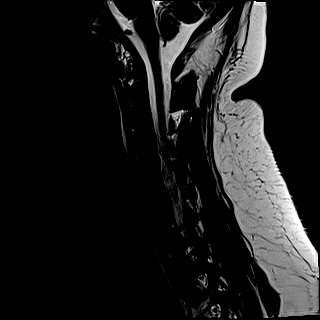
[im 15/15]
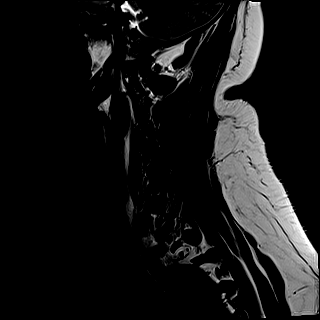

[Series 25: STIR · sagittal · 3.0mm · 0.62mm/px · 4 of 15 slices shown]
[im 1/15]
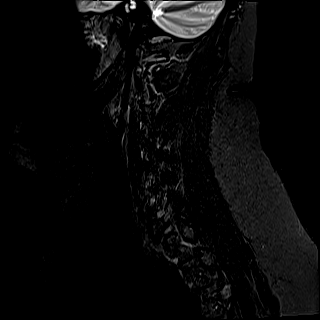
[im 5/15]
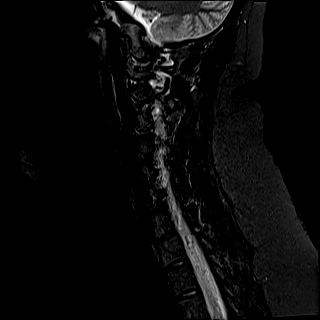
[im 10/15]
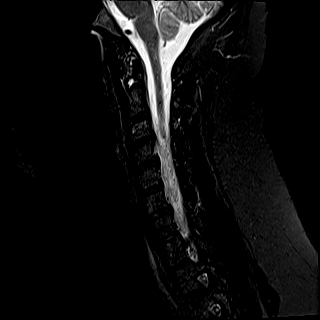
[im 15/15]
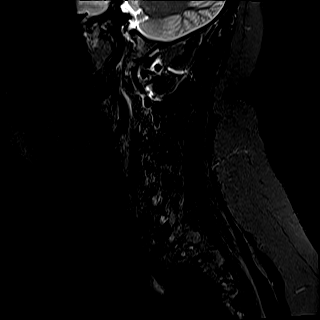

[Series 26: T2 · axial · 3.0mm · 0.70mm/px · z∈[-230,-137]mm · 8 of 29 slices shown (2 of 2)]
[im 1/29]
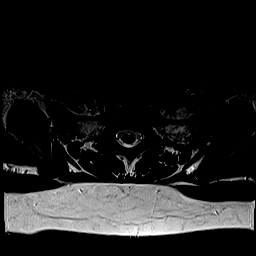
[im 5/29]
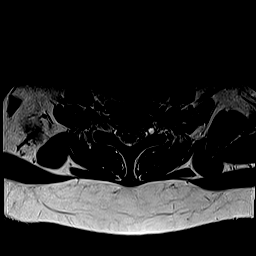
[im 9/29]
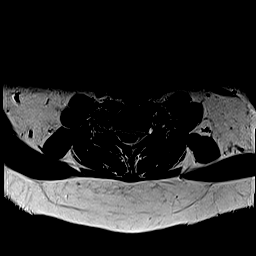
[im 13/29]
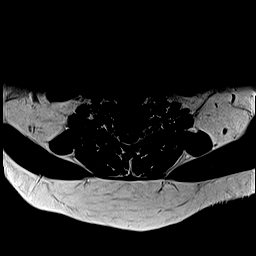
[im 17/29]
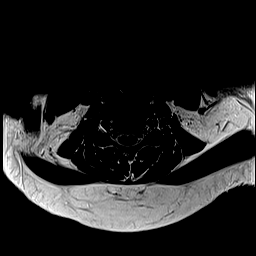
[im 21/29]
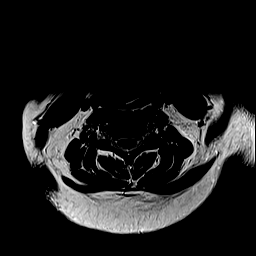
[im 25/29]
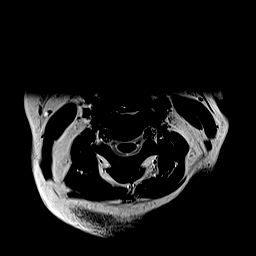
[im 29/29]
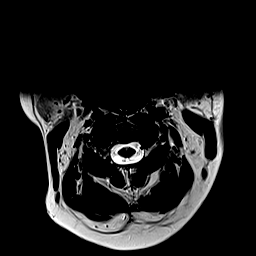

[Series 28: T1 · axial · non-contrast · 3.0mm · 0.35mm/px · z∈[-230,-137]mm · 8 of 29 slices shown]
[im 1/29]
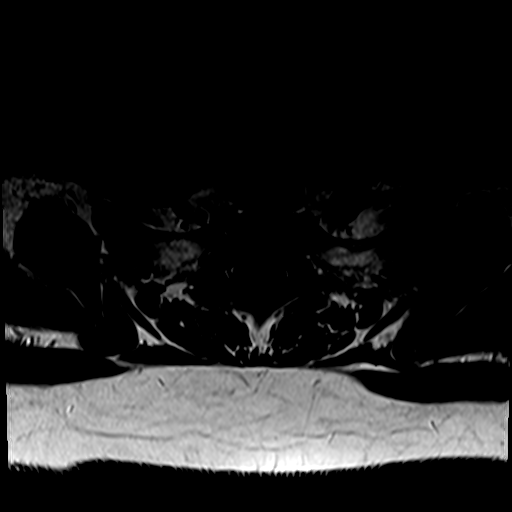
[im 5/29]
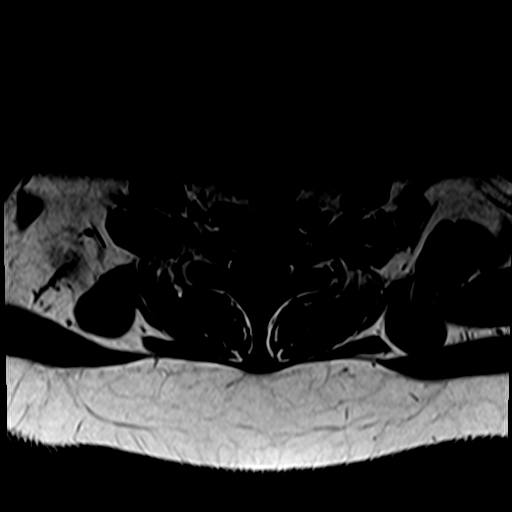
[im 9/29]
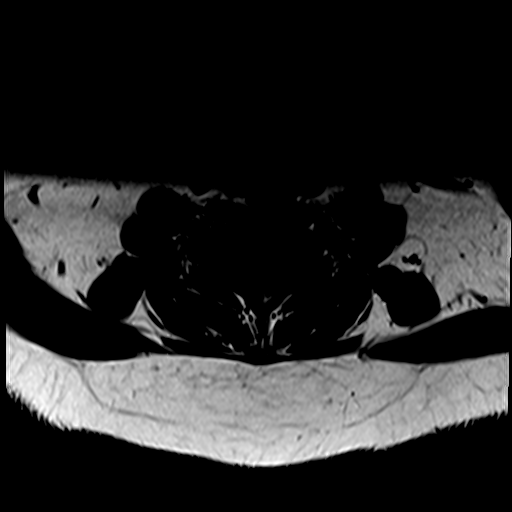
[im 13/29]
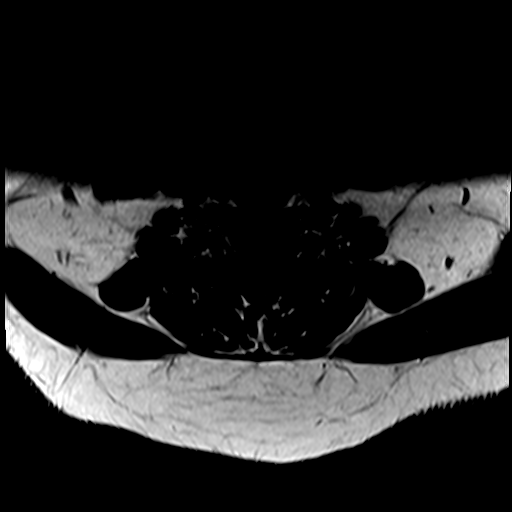
[im 17/29]
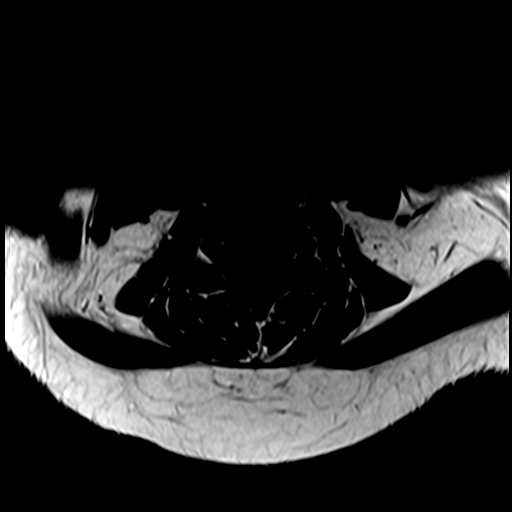
[im 21/29]
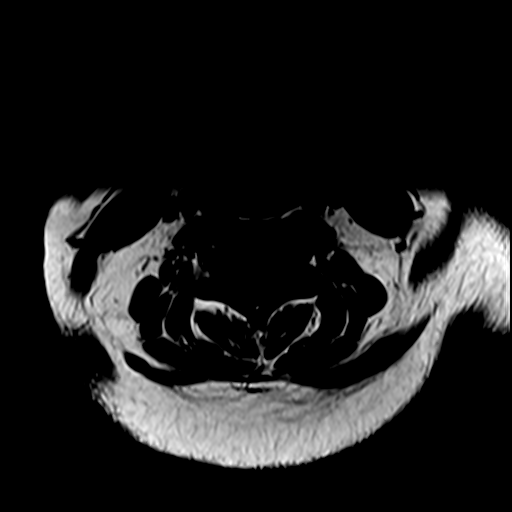
[im 25/29]
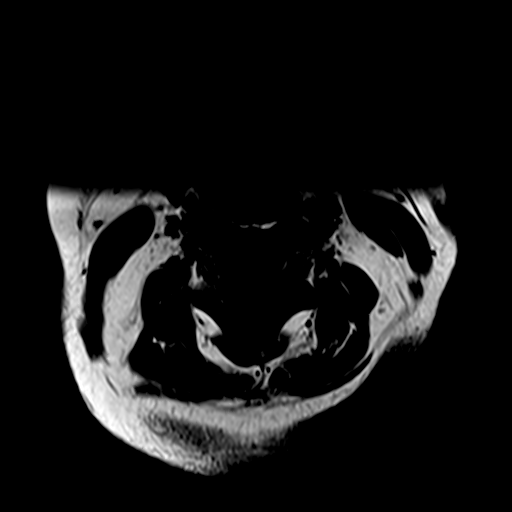
[im 29/29]
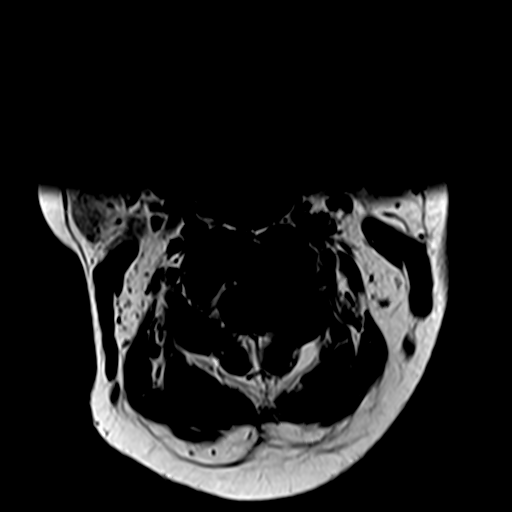

[Series 30: T1 post-contrast · axial · 3.0mm · 0.35mm/px · z∈[-230,-177]mm · 5 of 29 slices shown]
[im 1/29]
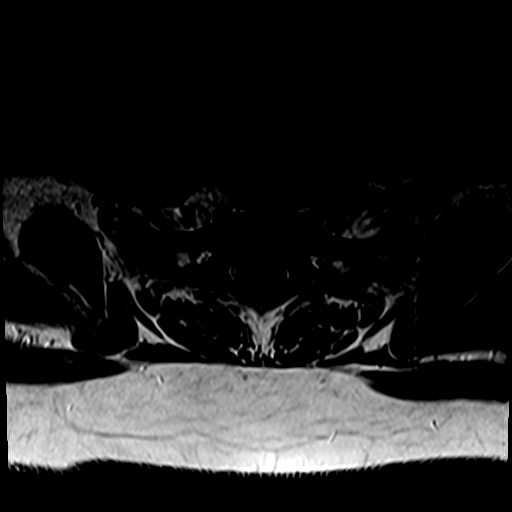
[im 5/29]
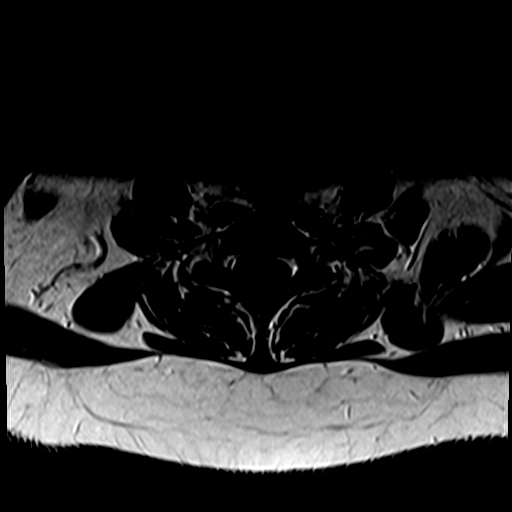
[im 9/29]
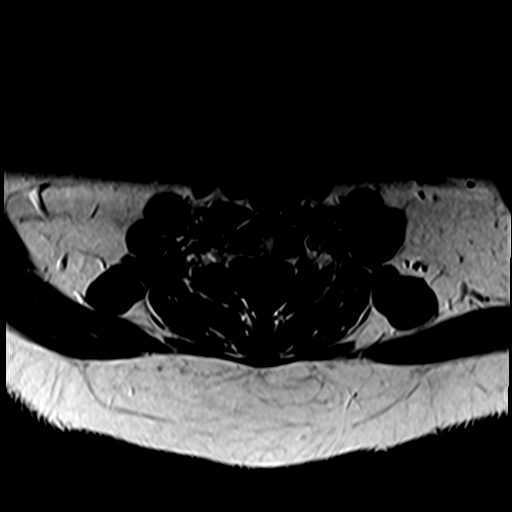
[im 13/29]
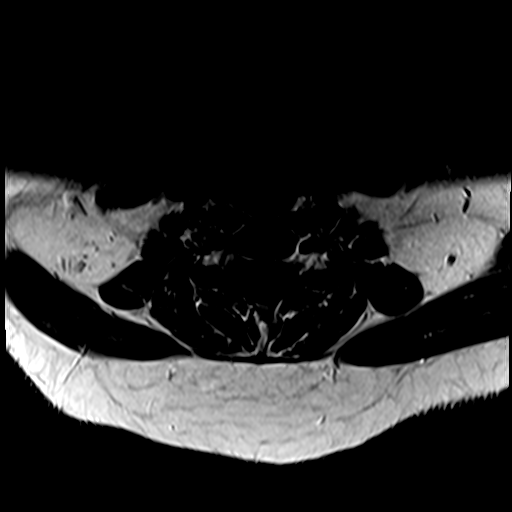
[im 17/29]
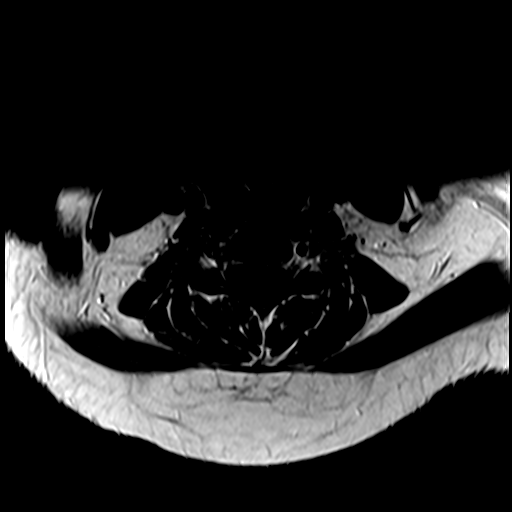

[29 of 48 positions shown; findings below may reference images not displayed]

FINDINGS: MRI HEAD FINDINGS

Brain: No acute infarct, mass effect or extra-axial collection. No
acute or chronic hemorrhage. There are a few scattered foci of
hyperintense T2-weighted signal within the white matter in a
nonspecific pattern, unchanged. The midline structures are normal.
There is no abnormal contrast enhancement.

Vascular: Major flow voids are preserved.

Skull and upper cervical spine: Normal calvarium and skull base.
Visualized upper cervical spine and soft tissues are normal.

Sinuses/Orbits:No paranasal sinus fluid levels or advanced mucosal
thickening. No mastoid or middle ear effusion. Normal orbits.

MRI CERVICAL SPINE FINDINGS

Alignment: Physiologic.

Vertebrae: C6 hemangioma

Cord: Unchanged lesions within the spinal cord at the C2, C4 and C7
levels. No abnormal contrast enhancement.

Posterior Fossa, vertebral arteries, paraspinal tissues: Negative.

Disc levels: No spinal canal or neural foraminal stenosis.
IMPRESSION: 1. No acute intracranial abnormality.
2. Unchanged distribution of cerebral white matter lesions in a
nonspecific pattern.
3. Unchanged appearance of demyelinating lesions in the cervical
spinal cord. Previously seen enhancement of the C4 lesion has
resolved.

## 2020-04-17 IMAGING — MR MR HEAD WO/W CM
15 series · 46 of 48 positions shown · IV contrast (9ml Gadavist)
Comparison: [DATE]

CLINICAL DATA: Upper and lower extremity weakness

EXAM:
MRI HEAD WITHOUT AND WITH CONTRAST
MRI CERVICAL SPINE WITHOUT AND WITH CONTRAST
TECHNIQUE: Multiplanar, multiecho pulse sequences of the brain and surrounding
structures, and cervical spine, to include the craniocervical
junction and cervicothoracic junction, were obtained without and
with intravenous contrast.
CONTRAST:  9mL GADAVIST GADOBUTROL 1 MMOL/ML IV SOLN

[Series 5: ax dwi_tracew · axial · 3.0mm · 0.60mm/px · z∈[-105,+50]mm · 3 of 48 slices shown]
[im 1/48]
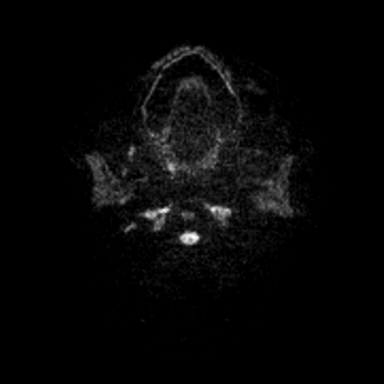
[im 24/48]
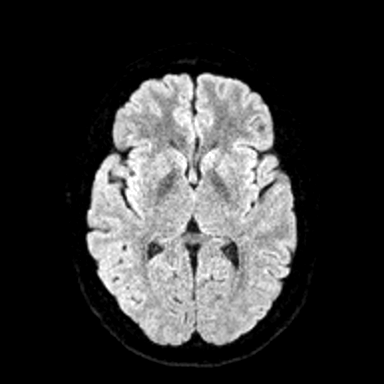
[im 48/48]
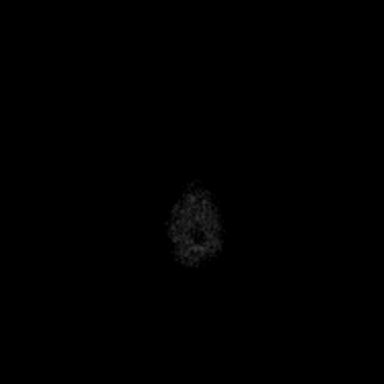

[Series 6: ax dwi_adc · axial · 3.0mm · 0.60mm/px · z∈[-105,+50]mm · 3 of 48 slices shown]
[im 1/48]
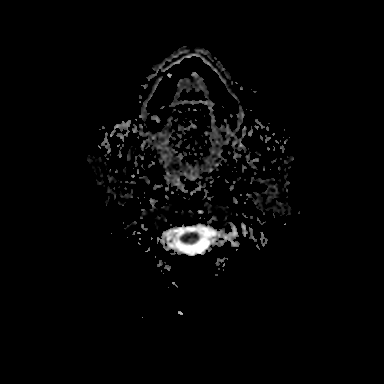
[im 24/48]
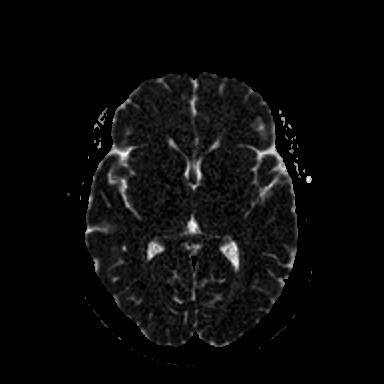
[im 48/48]
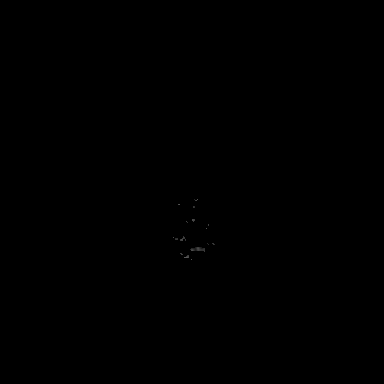

[Series 7: cor dwi_tracew · coronal · 5.0mm · 0.60mm/px · 2 of 38 slices shown]
[im 1/38]
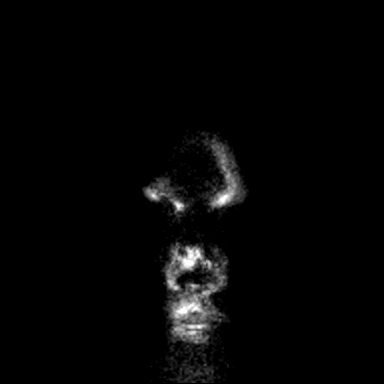
[im 38/38]
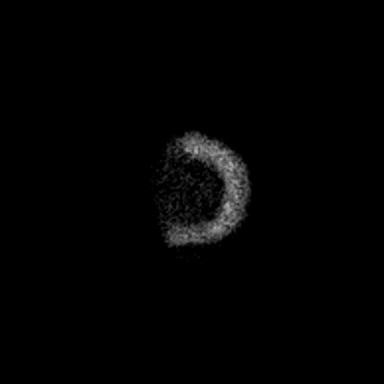

[Series 8: cor dwi_adc · coronal · 5.0mm · 0.60mm/px · 2 of 37 slices shown]
[im 1/37]
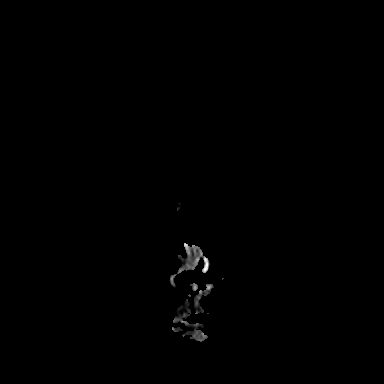
[im 37/37]
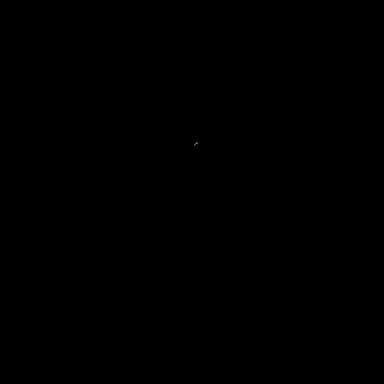

[Series 9: T1 · sagittal · 5.0mm · 0.62mm/px · 1 of 25 slices shown (1 of 2)]
[im 1/25]
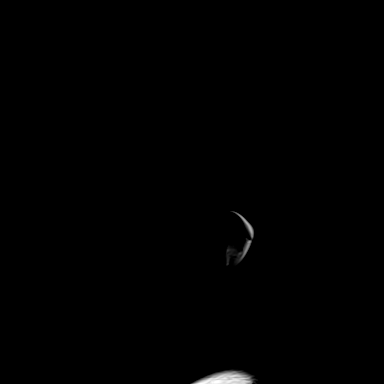

[Series 10: FLAIR · sagittal · 5.0mm · 0.94mm/px · 1 of 25 slices shown (1 of 2)]
[im 1/25]
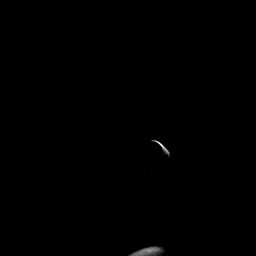

[Series 11: T2 · axial · 5.0mm · 0.53mm/px · z∈[-105,+50]mm · 2 of 27 slices shown (1 of 2)]
[im 1/27]
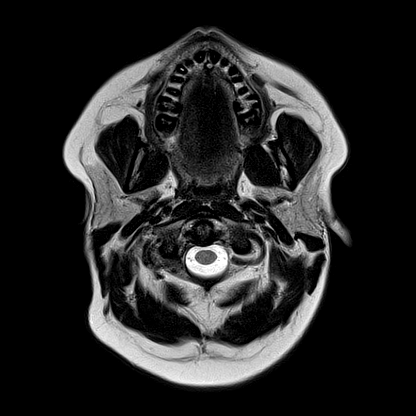
[im 27/27]
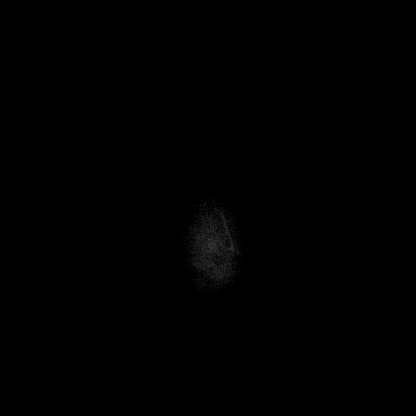

[Series 13: pha_images · axial · 3.0mm · 0.90mm/px · z∈[-116,+58]mm · 3 of 59 slices shown]
[im 1/59]
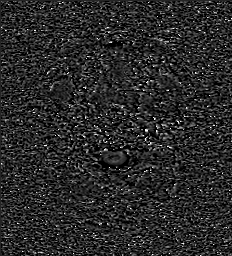
[im 30/59]
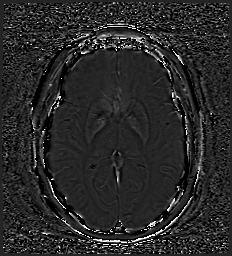
[im 59/59]
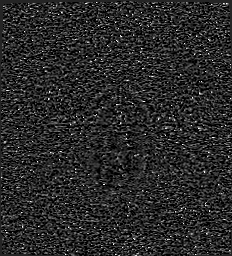

[Series 14: swi_images · axial · 3.0mm · 0.90mm/px · z∈[-116,+61]mm · 3 of 60 slices shown]
[im 1/60]
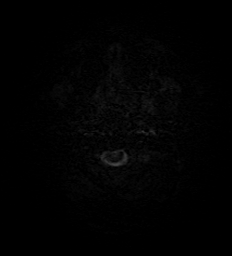
[im 30/60]
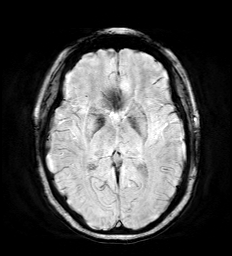
[im 60/60]
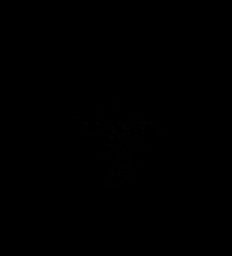

[Series 16: FLAIR · axial · 3.0mm · 0.53mm/px · z∈[-108,+53]mm · 3 of 55 slices shown (2 of 2)]
[im 1/55]
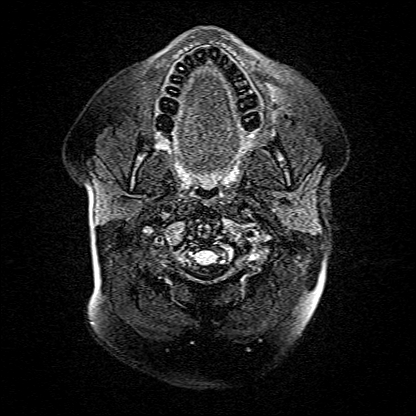
[im 28/55]
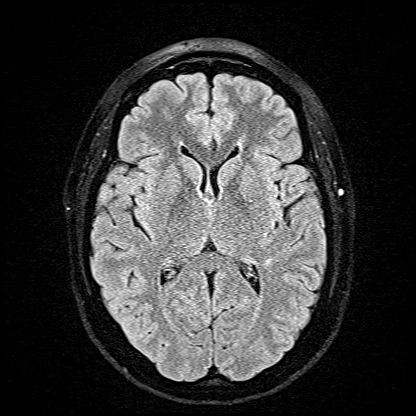
[im 55/55]
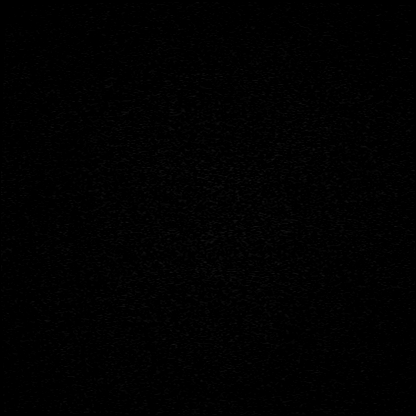

[Series 17: T1 · axial · 1.0mm · 0.98mm/px · z∈[-116,+58]mm · 8 of 175 slices shown (2 of 2)]
[im 1/175]
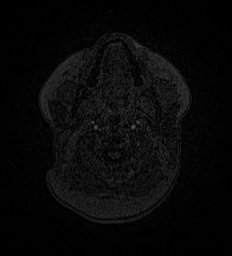
[im 20/175]
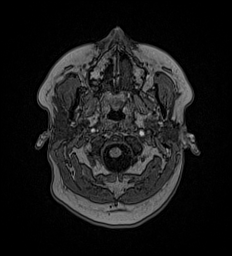
[im 59/175]
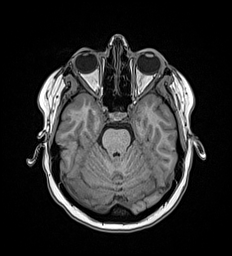
[im 78/175]
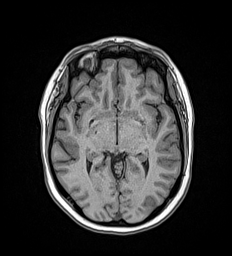
[im 97/175]
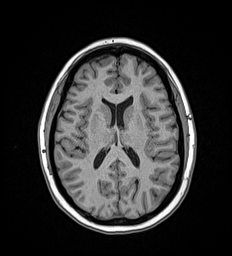
[im 117/175]
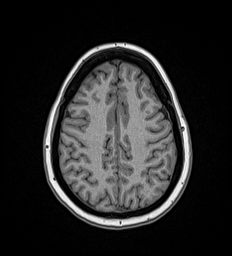
[im 155/175]
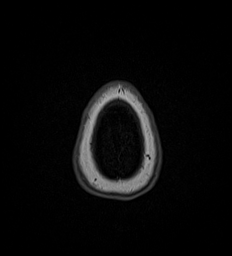
[im 175/175]
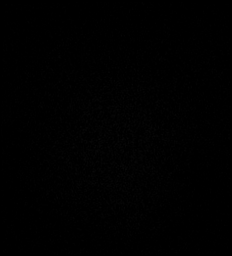

[Series 18: T2 · coronal · 5.0mm · 0.57mm/px · 2 of 29 slices shown (2 of 2)]
[im 1/29]
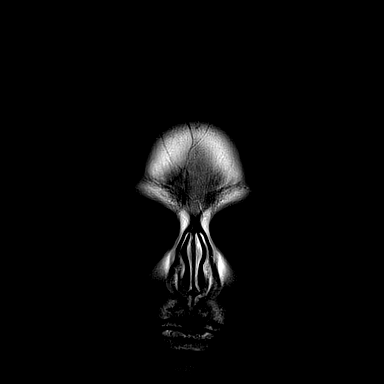
[im 29/29]
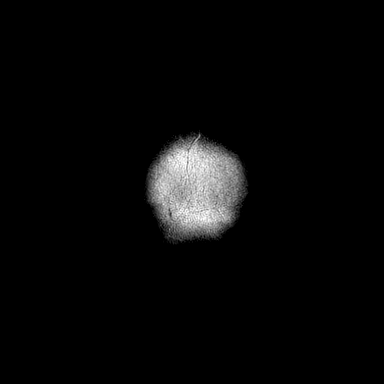

[Series 31: T1 post-contrast · axial · 1.0mm · 0.98mm/px · z∈[-116,+59]mm · 10 of 176 slices shown (1 of 3)]
[im 1/176]
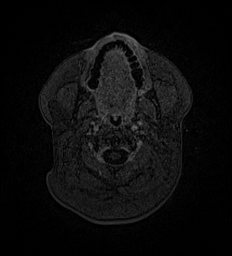
[im 20/176]
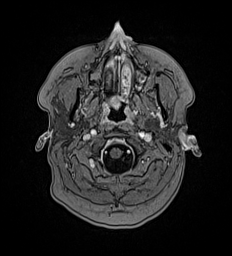
[im 39/176]
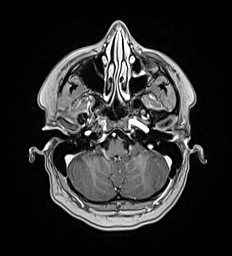
[im 59/176]
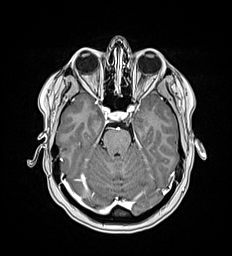
[im 78/176]
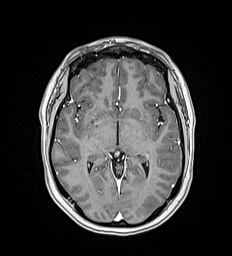
[im 98/176]
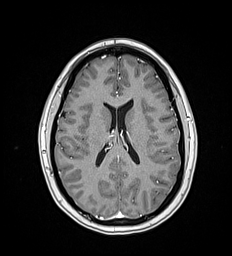
[im 117/176]
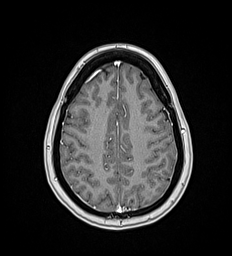
[im 137/176]
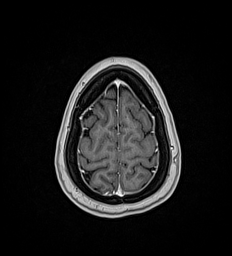
[im 156/176]
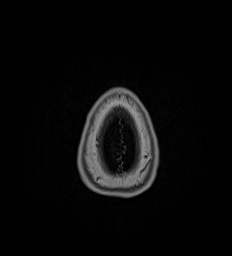
[im 176/176]
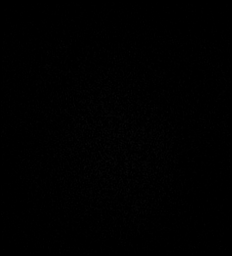

[Series 32: T1 post-contrast · coronal · 5.0mm · 0.57mm/px · 2 of 29 slices shown (2 of 3)]
[im 1/29]
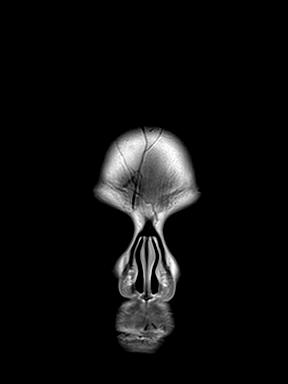
[im 29/29]
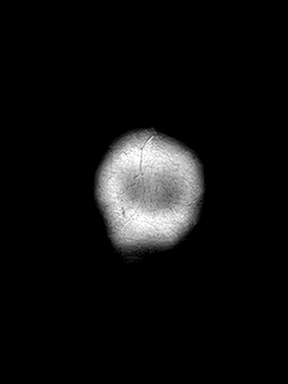

[Series 33: T1 post-contrast · sagittal · 5.0mm · 0.62mm/px · 1 of 25 slices shown (3 of 3)]
[im 1/25]
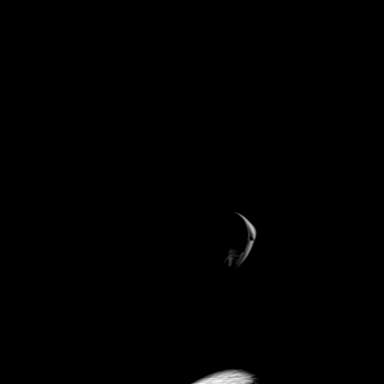

[46 of 48 positions shown; findings below may reference images not displayed]

FINDINGS: MRI HEAD FINDINGS

Brain: No acute infarct, mass effect or extra-axial collection. No
acute or chronic hemorrhage. There are a few scattered foci of
hyperintense T2-weighted signal within the white matter in a
nonspecific pattern, unchanged. The midline structures are normal.
There is no abnormal contrast enhancement.

Vascular: Major flow voids are preserved.

Skull and upper cervical spine: Normal calvarium and skull base.
Visualized upper cervical spine and soft tissues are normal.

Sinuses/Orbits:No paranasal sinus fluid levels or advanced mucosal
thickening. No mastoid or middle ear effusion. Normal orbits.

MRI CERVICAL SPINE FINDINGS

Alignment: Physiologic.

Vertebrae: C6 hemangioma

Cord: Unchanged lesions within the spinal cord at the C2, C4 and C7
levels. No abnormal contrast enhancement.

Posterior Fossa, vertebral arteries, paraspinal tissues: Negative.

Disc levels: No spinal canal or neural foraminal stenosis.
IMPRESSION: 1. No acute intracranial abnormality.
2. Unchanged distribution of cerebral white matter lesions in a
nonspecific pattern.
3. Unchanged appearance of demyelinating lesions in the cervical
spinal cord. Previously seen enhancement of the C4 lesion has
resolved.

## 2020-04-17 MED ORDER — POTASSIUM CHLORIDE 10 MEQ/100ML IV SOLN
10.0000 meq | INTRAVENOUS | Status: AC
  Administered 2020-04-17 – 2020-04-18 (×2): 10 meq via INTRAVENOUS
  Filled 2020-04-17: qty 100

## 2020-04-17 MED ORDER — SERTRALINE HCL 100 MG PO TABS
200.0000 mg | ORAL_TABLET | Freq: Every day | ORAL | Status: DC
  Administered 2020-04-18 – 2020-04-19 (×2): 200 mg via ORAL
  Filled 2020-04-17: qty 2
  Filled 2020-04-17 (×2): qty 4
  Filled 2020-04-17: qty 2

## 2020-04-17 MED ORDER — SODIUM CHLORIDE 0.9 % IV SOLN
250.0000 mL | INTRAVENOUS | Status: DC | PRN
  Administered 2020-04-19: 250 mL via INTRAVENOUS

## 2020-04-17 MED ORDER — ACETAMINOPHEN 650 MG RE SUPP: 650.0000 mg | Freq: Four times a day (QID) | RECTAL | Status: DC | PRN

## 2020-04-17 MED ORDER — CLONAZEPAM 0.5 MG PO TABS
0.5000 mg | ORAL_TABLET | Freq: Two times a day (BID) | ORAL | Status: DC | PRN
Start: 2020-04-17 — End: 2020-04-19
  Administered 2020-04-18: 0.5 mg via ORAL
  Filled 2020-04-17: qty 1

## 2020-04-17 MED ORDER — MAGNESIUM SULFATE 2 GM/50ML IV SOLN
2.0000 g | Freq: Once | INTRAVENOUS | Status: AC
  Administered 2020-04-17: 2 g via INTRAVENOUS
  Filled 2020-04-17: qty 50

## 2020-04-17 MED ORDER — PANTOPRAZOLE SODIUM 40 MG PO TBEC
40.0000 mg | DELAYED_RELEASE_TABLET | Freq: Every day | ORAL | Status: DC
  Administered 2020-04-18 – 2020-04-19 (×2): 40 mg via ORAL
  Filled 2020-04-17 (×2): qty 1

## 2020-04-17 MED ORDER — SODIUM CHLORIDE 0.9 % IV SOLN
1000.0000 mg | INTRAVENOUS | Status: DC
  Administered 2020-04-18 – 2020-04-19 (×2): 1000 mg via INTRAVENOUS
  Filled 2020-04-17 (×3): qty 8

## 2020-04-17 MED ORDER — ONDANSETRON HCL 4 MG PO TABS: 4.0000 mg | ORAL_TABLET | Freq: Four times a day (QID) | ORAL | Status: DC | PRN

## 2020-04-17 MED ORDER — INSULIN ASPART 100 UNIT/ML ~~LOC~~ SOLN: 0.0000 [IU] | Freq: Every day | SUBCUTANEOUS | Status: DC

## 2020-04-17 MED ORDER — ACETAMINOPHEN 325 MG PO TABS: 650.0000 mg | ORAL_TABLET | Freq: Four times a day (QID) | ORAL | Status: DC | PRN

## 2020-04-17 MED ORDER — ONDANSETRON HCL 4 MG/2ML IJ SOLN: 4.0000 mg | Freq: Four times a day (QID) | INTRAMUSCULAR | Status: DC | PRN

## 2020-04-17 MED ORDER — POTASSIUM CHLORIDE CRYS ER 20 MEQ PO TBCR
40.0000 meq | EXTENDED_RELEASE_TABLET | Freq: Two times a day (BID) | ORAL | Status: AC
  Administered 2020-04-17: 40 meq via ORAL
  Filled 2020-04-17 (×3): qty 2

## 2020-04-17 MED ORDER — GADOBUTROL 1 MMOL/ML IV SOLN
9.0000 mL | Freq: Once | INTRAVENOUS | Status: AC | PRN
  Administered 2020-04-17: 9 mL via INTRAVENOUS

## 2020-04-17 MED ORDER — INSULIN ASPART 100 UNIT/ML ~~LOC~~ SOLN
0.0000 [IU] | Freq: Three times a day (TID) | SUBCUTANEOUS | Status: DC
  Administered 2020-04-18 (×2): 2 [IU] via SUBCUTANEOUS
  Filled 2020-04-17 (×2): qty 1

## 2020-04-17 MED ORDER — POLYETHYLENE GLYCOL 3350 17 G PO PACK: 17.0000 g | PACK | Freq: Every day | ORAL | Status: DC | PRN

## 2020-04-17 NOTE — ED Triage Notes (Signed)
Pt to ED POV for chief complaint of weakness in both hands and arms that started at 1600 today.  Pt dx with MS 4 years ago Pt receiving solumedrol infusions, today was first infusion.  Pt reports she feels speech is slower than normal.  LKW 1400 Discussed pt with Dr Fuller Plan Pt with IV in place from prior treatment.  Pt to [facility]

## 2020-04-17 NOTE — ED Provider Notes (Signed)
[facility] Emergency Department Provider Note   ____________________________________________   I have reviewed the triage vital signs and the nursing notes.   HISTORY  Chief Complaint Weakness   History limited by: Not Limited   HPI Hartley Wyke is a 30 y.o. female who presents to the emergency department today because of concern for upper extremity weakness. The patient has history of MS and has been following with Dr. Sherryll Burger with neurology. Has been having an exacerbation recently and just had first dose of IV solumedrol today. Shortly thereafter started feeling increased weakness in her upper extremities. Also feels like she is having a harder time with her speech. She denies any difficulty with breathing or shortness of breath. Denies any fevers, or recent nausea or vomiting.    Records reviewed. Per medical record review patient has a history of MS  Past Medical History:  Diagnosis Date  . Anemia   . Depression   . Multiple sclerosis [facility])     Patient Active Problem List   Diagnosis Date Noted  . Labor and delivery indication for care or intervention 08/10/2018  . Depression 08/10/2018  . Multiple sclerosis (HCC) 08/10/2018  . Anemia affecting pregnancy 08/10/2018    Past Surgical History:  Procedure Laterality Date  . WRIST SURGERY Bilateral     Prior to Admission medications   Medication Sig Start Date End Date Taking? Authorizing Provider  ferrous sulfate 325 (65 FE) MG EC tablet Take 325 mg by mouth 3 (three) times daily with meals.    [provider]  ferrous sulfate 325 (65 FE) MG tablet Take 1 tablet (325 mg total) by mouth daily with breakfast. Take with Vitamin C 08/11/18 10/10/18  Christeen Douglas, MD  prenatal vitamin w/FE, FA (PRENATAL 1 + 1) 27-1 MG TABS tablet Take 1 tablet by mouth daily at 12 noon.    [provider]  sertraline (ZOLOFT) 100 MG tablet Take 150 mg by mouth daily.    [provider]     Allergies Patient has no known allergies.  Family History  Problem Relation Age of Onset  . Multiple sclerosis Father     Social History Social History   Tobacco Use  . Smoking status: Never Smoker  . Smokeless tobacco: Never Used  Substance Use Topics  . Alcohol use: Never  . Drug use: Never    Review of Systems Constitutional: No fever/chills Eyes: No visual changes. ENT: No sore throat. Cardiovascular: Denies chest pain. Respiratory: Denies shortness of breath. Gastrointestinal: No abdominal pain.  No nausea, no vomiting.  No diarrhea.   Genitourinary: Negative for dysuria. Musculoskeletal: Negative for back pain. Skin: Negative for rash. Neurological: Positive for upper extremity weakness, difficulty with speech, paresthesias to the lower extremities.  ____________________________________________   PHYSICAL EXAM:  VITAL SIGNS: ED Triage Vitals  Enc Vitals Group     BP 04/17/20 1819 132/75     Pulse Rate 04/17/20 1819 (!) 115     Resp 04/17/20 1819 18     Temp 04/17/20 1819 98.1 F (36.7 C)     Temp Source 04/17/20 1819 Oral     SpO2 04/17/20 1819 96 %     Weight 04/17/20 1821 209 lb (94.8 kg)     Height 04/17/20 1821 5\' 5"  (1.651 m)     Head Circumference --      Peak Flow --      Pain Score 04/17/20 1821 0   Constitutional: Alert and oriented.  Eyes: Conjunctivae are normal.  ENT      Head: Normocephalic and atraumatic.      Nose: No congestion/rhinnorhea.      Mouth/Throat: Mucous membranes are moist.      Neck: No stridor. Hematological/Lymphatic/Immunilogical: No cervical lymphadenopathy. Cardiovascular: Normal rate, regular rhythm.  No murmurs, rubs, or gallops.  Respiratory: Normal respiratory effort without tachypnea nor retractions. Breath sounds are clear and equal bilaterally. No wheezes/rales/rhonchi. Gastrointestinal: Soft and non tender. No rebound. No guarding.  Genitourinary: Deferred Musculoskeletal: Normal range of motion in  all extremities. No lower extremity edema. Neurologic:  Normal speech and language. No gross focal neurologic deficits are appreciated.  Skin:  Skin is warm, dry and intact. No rash noted. Psychiatric: Mood and affect are normal. Speech and behavior are normal. Patient exhibits appropriate insight and judgment.  ____________________________________________    LABS (pertinent positives/negatives)  None  ____________________________________________   EKG  None  ____________________________________________    RADIOLOGY  None  ____________________________________________   PROCEDURES  Procedures  ____________________________________________   INITIAL IMPRESSION / ASSESSMENT AND PLAN / ED COURSE  Pertinent labs & imaging results that were available during my care of the patient were reviewed by me and considered in my medical decision making (see chart for details).   Presented to the emergency department today because of concerns for upper extremity weakness and lower extremity weakness in the setting of just starting IV steroids for MS exacerbation.  On exam patient does have profound weakness in extremities.  Given worsening condition will plan on admission. Discussed with Dr. Otelia Limes with neurology who recommended MR brain and cervical spine.   ____________________________________________   FINAL CLINICAL IMPRESSION(S) / ED DIAGNOSES  Final diagnoses:  Upper extremity weakness     Note: This dictation was prepared with Dragon dictation. Any transcriptional errors that result from this process are unintentional     Phineas Semen, MD 04/17/20 2116

## 2020-04-17 NOTE — ED Notes (Signed)
Pt transported to MRI 

## 2020-04-17 NOTE — H&P (Addendum)
History and Physical    [name] EVO:[id number] DOB: [birth year] DOA: 04/17/2020  PCP: Patrice Paradise, MD   Patient coming from: Home   Chief Complaint: Bilateral arm weakness, headache    HPI: Kelly David is a 30 y.o. female with medical history significant for depression and multiple sclerosis, now presenting to the emergency department for evaluation of bilateral upper extremity weakness.  Patient reports history of multiple sclerosis, diagnosed in 2017 in [place] based on MRI findings and history, has never had lumbar puncture, and has not been on any medications since 2018, has been following with Dr. Sherryll Burger of neurology and reports starting treatment with IV Solu-Medrol in the outpatient setting earlier today for suspected MS flare.  She developed right lower extremity paresthesias roughly a month ago, improved some with prednisone taper but the symptoms began to worsen again shortly after completing that. She developed a headache approximately 2 weeks ago, also reports proximal leg weakness bilaterally, began treatment with IV Solu-Medrol earlier today with plan for 3-day course, but then developed bilateral upper extremity weakness this afternoon which prompted her presentation to the ED. she describes weakness mainly in the bilateral hands but also involving the forearms.  She denies any fevers, chills, cough, shortness of breath, chest pain, or palpitations.  ED Course: Upon arrival to the ED, patient is found to be afebrile, saturating well on room air, slightly tachycardic, and with stable blood pressure.  Neurology was contacted by the ED physician.  Hospitalist were asked to admit.  MRI brain and cervical spine have been ordered but not yet performed.  Review of Systems:  All other systems reviewed and apart from HPI, are negative.  Past Medical History:  Diagnosis Date  . Anemia   . Depression   . Multiple sclerosis (HCC)     Past Surgical History:  Procedure  Laterality Date  . WRIST SURGERY Bilateral     Social History:   reports that she has never smoked. She has never used smokeless tobacco. She reports that she does not drink alcohol and does not use drugs.  No Known Allergies  Family History  Problem Relation Age of Onset  . Multiple sclerosis Father      Prior to Admission medications   Medication Sig Start Date End Date Taking? Authorizing Provider  ferrous sulfate 325 (65 FE) MG EC tablet Take 325 mg by mouth 3 (three) times daily with meals.    [provider]  ferrous sulfate 325 (65 FE) MG tablet Take 1 tablet (325 mg total) by mouth daily with breakfast. Take with Vitamin C 08/11/18 10/10/18  Christeen Douglas, MD  prenatal vitamin w/FE, FA (PRENATAL 1 + 1) 27-1 MG TABS tablet Take 1 tablet by mouth daily at 12 noon.    [provider]  sertraline (ZOLOFT) 100 MG tablet Take 150 mg by mouth daily.    [provider]    Physical Exam: Vitals:   04/17/20 1819 04/17/20 1821 04/17/20 1912  BP: 132/75  123/77  Pulse: (!) 115  (!) 105  Resp: 18  17  Temp: 98.1 F (36.7 C)    TempSrc: Oral    SpO2: 96%  96%  Weight:  94.8 kg   Height:  5\' 5"  (1.651 m)     Constitutional: NAD, calm  Eyes: PERTLA, lids and conjunctivae normal ENMT: Mucous membranes are moist. Posterior pharynx clear of any exudate or lesions.   Neck: normal, supple, no masses, no thyromegaly Respiratory:  no wheezing, no crackles.  No accessory muscle use.  Cardiovascular: S1 & S2 heard, regular rate and rhythm. No extremity edema.   Abdomen: No distension, no tenderness, soft. Bowel sounds active.  Musculoskeletal: no clubbing / cyanosis. No joint deformity upper and lower extremities.   Skin: no significant rashes, lesions, ulcers. Warm, dry, well-perfused. Neurologic: CN 2-12 grossly intact. Sensation diminished in RLE. Strength 3/5 in distal UEs bilaterally, 4/5 in proximal UEs b/l, 3/5 in b/l prox LEs, and 4/5 involving b/l distal  LEs.   Psychiatric: Alert and oriented to person, place, and situation. Pleasant and cooperative.    Labs and Imaging on Admission: I have personally reviewed following labs and imaging studies  CBC: No results for input(s): WBC, NEUTROABS, HGB, HCT, MCV, PLT in the last 168 hours. Basic Metabolic Panel: No results for input(s): NA, K, CL, CO2, GLUCOSE, BUN, CREATININE, CALCIUM, MG, PHOS in the last 168 hours. GFR: CrCl cannot be calculated (No successful lab value found.). Liver Function Tests: No results for input(s): AST, ALT, ALKPHOS, BILITOT, PROT, ALBUMIN in the last 168 hours. No results for input(s): LIPASE, AMYLASE in the last 168 hours. No results for input(s): AMMONIA in the last 168 hours. Coagulation Profile: No results for input(s): INR, PROTIME in the last 168 hours. Cardiac Enzymes: No results for input(s): CKTOTAL, CKMB, CKMBINDEX, TROPONINI in the last 168 hours. BNP (last 3 results) No results for input(s): PROBNP in the last 8760 hours. HbA1C: No results for input(s): HGBA1C in the last 72 hours. CBG: No results for input(s): GLUCAP in the last 168 hours. Lipid Profile: No results for input(s): CHOL, HDL, LDLCALC, TRIG, CHOLHDL, LDLDIRECT in the last 72 hours. Thyroid Function Tests: No results for input(s): TSH, T4TOTAL, FREET4, T3FREE, THYROIDAB in the last 72 hours. Anemia Panel: No results for input(s): VITAMINB12, FOLATE, FERRITIN, TIBC, IRON, RETICCTPCT in the last 72 hours. Urine analysis: No results found for: COLORURINE, APPEARANCEUR, LABSPEC, PHURINE, GLUCOSEU, HGBUR, BILIRUBINUR, KETONESUR, PROTEINUR, UROBILINOGEN, NITRITE, LEUKOCYTESUR Sepsis Labs: @LABRCNTIP (procalcitonin:4,lacticidven:4) )No results found for this or any previous visit (from the past 240 hour(s)).   Radiological Exams on Admission: No results found.   Assessment/Plan   1. Bilateral UE weakness; Multiple sclerosis  - Presents with acute b/l distal UE weakness after  starting IV Solu-Medrol earlier in the day for suspected MS flare  - Check MRI brain and c-spine, continue IV Solu-Medrol, neuro checks, supportive care, consult neurology    2. Depression  - Continue Zoloft   ADDENDUM: Chemistry panel has resulted and notable for potassium 2.5 and glucose 262. Plan to administer PO and IV potassium, check A1c, monitor CBGs, and start a low-intensity SSI.     DVT prophylaxis: SCDs  Code Status: Full  Family Communication: Discussed with patient  Disposition Plan:  Patient is from: Home   Anticipated d/c is to: TBD Anticipated d/c date is: 04/21/20 Patient currently: Pending imaging, treatment with IV medications Consults called: Message sent to neurology for routine consult request  Admission status: Inpatient     04/23/20, MD [facility]  04/17/2020, 8:22 PM

## 2020-04-17 NOTE — ED Notes (Signed)
Date and time results received: 04/17/20 2135 (use smartphrase ".now" to insert current time)  Test: CMP Critical Value: K+ 2.5  Name of Provider Notified: Timothy Opyd Orders Received? Or Actions Taken?: Orders Received - See Orders for details

## 2020-04-18 DIAGNOSIS — G35 Multiple sclerosis: Principal | ICD-10-CM

## 2020-04-18 DIAGNOSIS — E876 Hypokalemia: Secondary | ICD-10-CM | POA: Diagnosis not present

## 2020-04-18 LAB — BASIC METABOLIC PANEL
Anion gap: 8 (ref 5–15)
BUN: 6 mg/dL (ref 6–20)
CO2: 19 mmol/L — ABNORMAL LOW (ref 22–32)
Calcium: 8.6 mg/dL — ABNORMAL LOW (ref 8.9–10.3)
Chloride: 110 mmol/L (ref 98–111)
Creatinine, Ser: 0.66 mg/dL (ref 0.44–1.00)
GFR, Estimated: >60 mL/min (ref >60)
Glucose, Bld: 206 mg/dL — ABNORMAL HIGH (ref 70–99)
Potassium: 4.5 mmol/L (ref 3.5–5.1)
Sodium: 137 mmol/L (ref 135–145)

## 2020-04-18 LAB — MAGNESIUM: Magnesium: 2.1 mg/dL (ref 1.7–2.4)

## 2020-04-18 LAB — CBG MONITORING, ED: Glucose-Capillary: 175 mg/dL — ABNORMAL HIGH (ref 70–99)

## 2020-04-18 LAB — CBC
HCT: 39 % (ref 36.0–46.0)
Hemoglobin: 12.6 g/dL (ref 12.0–15.0)
MCH: 30.3 pg (ref 26.0–34.0)
MCHC: 32.3 g/dL (ref 30.0–36.0)
MCV: 93.8 fL (ref 80.0–100.0)
Platelets: 235 10*3/uL (ref 150–400)
RBC: 4.16 MIL/uL (ref 3.87–5.11)
RDW: 14.5 % (ref 11.5–15.5)
WBC: 18 10*3/uL — ABNORMAL HIGH (ref 4.0–10.5)
nRBC: 0 % (ref 0.0–0.2)

## 2020-04-18 LAB — SARS CORONAVIRUS 2 (TAT 6-24 HRS): SARS Coronavirus 2: NEGATIVE

## 2020-04-18 LAB — GLUCOSE, CAPILLARY
Glucose-Capillary: 115 mg/dL — ABNORMAL HIGH (ref 70–99)
Glucose-Capillary: 151 mg/dL — ABNORMAL HIGH (ref 70–99)
Glucose-Capillary: 165 mg/dL — ABNORMAL HIGH (ref 70–99)
Glucose-Capillary: 196 mg/dL — ABNORMAL HIGH (ref 70–99)

## 2020-04-18 LAB — HIV ANTIBODY (ROUTINE TESTING W REFLEX): HIV Screen 4th Generation wRfx: NONREACTIVE

## 2020-04-18 LAB — HEMOGLOBIN A1C
Hgb A1c MFr Bld: 5 % (ref 4.8–5.6)
Mean Plasma Glucose: 96.8 mg/dL

## 2020-04-18 NOTE — Progress Notes (Signed)
PROGRESS NOTE    [name]  AST:[id number] DOB: 04-Dec-1990 DOA: 04/17/2020 PCP: Patrice Paradise, MD   Brief Narrative: Taken from H&P. Kelly David is a 30 y.o. female with medical history significant for depression and multiple sclerosis, now presenting to the emergency department for evaluation of bilateral upper extremity weakness.  Patient reports history of multiple sclerosis, diagnosed in 2017 in [place] based on MRI findings and history, has never had lumbar puncture, and has not been on any medications since 2018, has been following with Dr. Sherryll Burger of neurology and reports starting treatment with IV Solu-Medrol in the outpatient setting earlier today for suspected MS flare.  She developed right lower extremity paresthesias roughly a month ago, improved some with prednisone taper but the symptoms began to worsen again shortly after completing that. She developed a headache approximately 2 weeks ago, also reports proximal leg weakness bilaterally, began treatment with IV Solu-Medrol earlier today with plan for 3-day course, but then developed bilateral upper extremity weakness this afternoon which prompted her presentation to the ED. she describes weakness mainly in the bilateral hands but also involving the forearms.  She denies any fevers, chills, cough, shortness of breath, chest pain, or palpitations. MRI brain and cervical spine without any new changes. She was started on high-dose steroid for MS flare, symptoms improving today.  Subjective: Patient was feeling little improved when seen today.  Her weakness seems improving.  Denies any new symptoms.  Assessment & Plan:   Principal Problem:   Multiple sclerosis (HCC) Active Problems:   Upper extremity weakness   Hypokalemia   Drug-induced hyperglycemia  Multiple sclerosis flare.  Her bilateral upper extremity weakness can be due to MS flare.  No new changes on imaging.  Responding well to high-dose steroid. -Neurology  was consulted-appreciate their recommendations. -Most likely can be discharged tomorrow after getting 3 doses of high-dose steroid.  Hypokalemia.  Most likely with high-dose steroid.  Potassium improved with replacement.  Magnesium within normal limits.  Hyperglycemia.  Most likely steroid-induced.  No prior diagnosis of diabetes and A1c came back at 5. -Continue to monitor -SSI as needed  Depression. -Continue home dose of Zoloft  Objective: Vitals:   04/18/20 0110 04/18/20 0427 04/18/20 0826 04/18/20 1200  BP: 129/81 120/62 121/64 119/70  Pulse: 87 94 75 99  Resp:   18 18  Temp: 98 F (36.7 C) 98.3 F (36.8 C) 98.1 F (36.7 C) 98.2 F (36.8 C)  TempSrc: Oral Oral    SpO2: 100% 95% 96% 96%  Weight:      Height:        Intake/Output Summary (Last 24 hours) at 04/18/2020 1235 Last data filed at 04/18/2020 1022 Gross per 24 hour  Intake 486.67 ml  Output --  Net 486.67 ml   Filed Weights   04/17/20 1821  Weight: 94.8 kg    Examination:  General exam: Appears calm and comfortable  Respiratory system: Clear to auscultation. Respiratory effort normal. Cardiovascular system: S1 & S2 heard, RRR. Gastrointestinal system: Soft, nontender, nondistended, bowel sounds positive. Central nervous system: Alert and oriented. No focal neurological deficits.Symmetric 5 x 5 power. Extremities: No edema, no cyanosis, pulses intact and symmetrical. Skin: No rashes, lesions or ulcers Psychiatry: Judgement and insight appear normal. Mood & affect appropriate.    DVT prophylaxis: SCDs Code Status: Full Family Communication: Discussed with patient Disposition Plan:  Status is: Inpatient  Remains inpatient appropriate because:Inpatient level of care appropriate due to severity of illness   Dispo: The  patient is from: Home              Anticipated d/c is to: Home              Anticipated d/c date is: 1 day              Patient currently is not medically stable to  d/c.   Consultants:   Neurology  Procedures:  Antimicrobials:   Data Reviewed: I have personally reviewed following labs and imaging studies  CBC: Recent Labs  Lab 04/17/20 1828 04/18/20 0320  WBC 9.5 18.0*  NEUTROABS 8.5*  --   HGB 13.5 12.6  HCT 41.9 39.0  MCV 94.6 93.8  PLT 235 235   Basic Metabolic Panel: Recent Labs  Lab 04/17/20 1828 04/18/20 0320  NA 137 137  K 2.5* 4.5  CL 108 110  CO2 18* 19*  GLUCOSE 262* 206*  BUN 10 6  CREATININE 0.82 0.66  CALCIUM 9.3 8.6*  MG  --  2.1   GFR: Estimated Creatinine Clearance: 116 mL/min (by C-G formula based on SCr of 0.66 mg/dL). Liver Function Tests: Recent Labs  Lab 04/17/20 1828  AST 36  ALT 21  ALKPHOS 84  BILITOT 0.6  PROT 8.0  ALBUMIN 4.2   No results for input(s): LIPASE, AMYLASE in the last 168 hours. No results for input(s): AMMONIA in the last 168 hours. Coagulation Profile: No results for input(s): INR, PROTIME in the last 168 hours. Cardiac Enzymes: No results for input(s): CKTOTAL, CKMB, CKMBINDEX, TROPONINI in the last 168 hours. BNP (last 3 results) No results for input(s): PROBNP in the last 8760 hours. HbA1C: Recent Labs    04/18/20 0320  HGBA1C 5.0   CBG: Recent Labs  Lab 04/17/20 2330 04/18/20 0827 04/18/20 1202  GLUCAP 175* 115* 151*   Lipid Profile: No results for input(s): CHOL, HDL, LDLCALC, TRIG, CHOLHDL, LDLDIRECT in the last 72 hours. Thyroid Function Tests: No results for input(s): TSH, T4TOTAL, FREET4, T3FREE, THYROIDAB in the last 72 hours. Anemia Panel: No results for input(s): VITAMINB12, FOLATE, FERRITIN, TIBC, IRON, RETICCTPCT in the last 72 hours. Sepsis Labs: No results for input(s): PROCALCITON, LATICACIDVEN in the last 168 hours.  Recent Results (from the past 240 hour(s))  SARS CORONAVIRUS 2 (TAT 6-24 HRS) Nasopharyngeal Nasopharyngeal Swab     Status: None   Collection Time: 04/17/20  8:15 PM   Specimen: Nasopharyngeal Swab  Result Value Ref Range  Status   SARS Coronavirus 2 NEGATIVE NEGATIVE Final    Comment: (NOTE) SARS-CoV-2 target nucleic acids are NOT DETECTED.  The SARS-CoV-2 RNA is generally detectable in upper and lower respiratory specimens during the acute phase of infection. Negative results do not preclude SARS-CoV-2 infection, do not rule out co-infections with other pathogens, and should not be used as the sole basis for treatment or other patient management decisions. Negative results must be combined with clinical observations, patient history, and epidemiological information. The expected result is Negative.  Fact Sheet for Patients: HairSlick.no  Fact Sheet for Healthcare Providers: quierodirigir.com  This test is not yet approved or cleared by the [place] FDA and  has been authorized for detection and/or diagnosis of SARS-CoV-2 by FDA under an Emergency Use Authorization (EUA). This EUA will remain  in effect (meaning this test can be used) for the duration of the COVID-19 declaration under Se ction 564(b)(1) of the Act, 21 U.S.C. section 360bbb-3(b)(1), unless the authorization is terminated or revoked sooner.  Performed at [facility], [address], [place], [place] [address]      Radiology Studies: MR Brain W and Wo Contrast  Result Date: 04/17/2020 CLINICAL DATA:  Upper and lower extremity weakness EXAM: MRI HEAD WITHOUT AND WITH CONTRAST MRI CERVICAL SPINE WITHOUT AND WITH CONTRAST TECHNIQUE: Multiplanar, multiecho pulse sequences of the brain and surrounding structures, and cervical spine, to include the craniocervical junction and cervicothoracic junction, were obtained without and with intravenous contrast. CONTRAST:  2mL GADAVIST GADOBUTROL 1 MMOL/ML IV SOLN COMPARISON:  04/09/2020 FINDINGS: MRI HEAD FINDINGS Brain: No acute infarct, mass effect or extra-axial collection. No acute or chronic hemorrhage. There are a few scattered  foci of hyperintense T2-weighted signal within the white matter in a nonspecific pattern, unchanged. The midline structures are normal. There is no abnormal contrast enhancement. Vascular: Major flow voids are preserved. Skull and upper cervical spine: Normal calvarium and skull base. Visualized upper cervical spine and soft tissues are normal. Sinuses/Orbits:No paranasal sinus fluid levels or advanced mucosal thickening. No mastoid or middle ear effusion. Normal orbits. MRI CERVICAL SPINE FINDINGS Alignment: Physiologic. Vertebrae: C6 hemangioma Cord: Unchanged lesions within the spinal cord at the C2, C4 and C7 levels. No abnormal contrast enhancement. Posterior Fossa, vertebral arteries, paraspinal tissues: Negative. Disc levels: No spinal canal or neural foraminal stenosis. IMPRESSION: 1. No acute intracranial abnormality. 2. Unchanged distribution of cerebral white matter lesions in a nonspecific pattern. 3. Unchanged appearance of demyelinating lesions in the cervical spinal cord. Previously seen enhancement of the C4 lesion has resolved. Electronically Signed   By: Deatra Robinson M.D.   On: 04/17/2020 23:21   MR Cervical Spine W or Wo Contrast  Result Date: 04/17/2020 CLINICAL DATA:  Upper and lower extremity weakness EXAM: MRI HEAD WITHOUT AND WITH CONTRAST MRI CERVICAL SPINE WITHOUT AND WITH CONTRAST TECHNIQUE: Multiplanar, multiecho pulse sequences of the brain and surrounding structures, and cervical spine, to include the craniocervical junction and cervicothoracic junction, were obtained without and with intravenous contrast. CONTRAST:  55mL GADAVIST GADOBUTROL 1 MMOL/ML IV SOLN COMPARISON:  04/09/2020 FINDINGS: MRI HEAD FINDINGS Brain: No acute infarct, mass effect or extra-axial collection. No acute or chronic hemorrhage. There are a few scattered foci of hyperintense T2-weighted signal within the white matter in a nonspecific pattern, unchanged. The midline structures are normal. There is no  abnormal contrast enhancement. Vascular: Major flow voids are preserved. Skull and upper cervical spine: Normal calvarium and skull base. Visualized upper cervical spine and soft tissues are normal. Sinuses/Orbits:No paranasal sinus fluid levels or advanced mucosal thickening. No mastoid or middle ear effusion. Normal orbits. MRI CERVICAL SPINE FINDINGS Alignment: Physiologic. Vertebrae: C6 hemangioma Cord: Unchanged lesions within the spinal cord at the C2, C4 and C7 levels. No abnormal contrast enhancement. Posterior Fossa, vertebral arteries, paraspinal tissues: Negative. Disc levels: No spinal canal or neural foraminal stenosis. IMPRESSION: 1. No acute intracranial abnormality. 2. Unchanged distribution of cerebral white matter lesions in a nonspecific pattern. 3. Unchanged appearance of demyelinating lesions in the cervical spinal cord. Previously seen enhancement of the C4 lesion has resolved. Electronically Signed   By: Deatra Robinson M.D.   On: 04/17/2020 23:21    Scheduled Meds: . insulin aspart  0-5 Units Subcutaneous QHS  . insulin aspart  0-9 Units Subcutaneous TID WC  . pantoprazole  40 mg Oral Daily  . potassium chloride  40 mEq Oral BID  . sertraline  200 mg Oral Daily   Continuous Infusions: . sodium chloride    . methylPREDNISolone (SOLU-MEDROL) injection 1,000 mg (  04/18/20 1057)     LOS: 1 day   Time spent: 35 minutes.  Arnetha Courser, MD [facility]  If 7PM-7AM, please contact night-coverage [email or web address]  04/18/2020, 12:35 PM   This record has been created using Conservation officer, historic buildings. Errors have been sought and corrected,but may not always be located. Such creation errors do not reflect on the standard of care.

## 2020-04-18 NOTE — Progress Notes (Signed)
Orders received to d/c tele

## 2020-04-18 NOTE — Consult Note (Signed)
NEURO HOSPITALIST CONSULT NOTE   Requestig physician: Dr. Nelson Chimes  Reason for Consult: Worsening MS symptoms  History obtained from:  Patient and Chart     HPI:                                                                                                                                          Kelly David is an 30 y.o. female with MS diagnosed in 2017 (based on MRI findings and history, with no LP) who presents with worsened weakness in her bilateral hands and arms starting at 1600 on Tuesday. She also felt that her speech was slower than normal. She had received her first dose of IV Solumedrol as an outpatient for what was felt to be an MS exacerbation, but then experienced worsened BUE weakness.   Her outpatient Neurologist is Dr. Sherryll Burger, who follows her for her MS. She has not been on disease modifying therapy since 2018 but was recently prescribed Copaxone, which she has not started taking yet. She was treated with prednisone last month for RLE tingling, which improved initially but subsequently worsened, resulting in the decision to treat her with a 3 day course of IV Solumedrol. MRI T-spine and C-spine from 1/3 revealed a T2 hyperintense lesion within the dorsal right cord at the C6-C7 level, which appeared new from the cervical spine MRI of 02/25/2019, additional cervical spinal cord lesions were seen, which appeared chronic, and there was one punctate acute enhancing lesion at the C4 level. The thoracic cord signal was unremarkable. MRI brain from the same date showed sparse white matter lesions without abnormal enhancement; no brain atrophy was noted.   In the ED, her potassium came back low at 2.5 and glucose was elevated at 262, most likely due to the mineralocorticoid activity of the Solumedrol she received. PO and IV potassium were ordered and she was started on low intensity SSI.   MRI brain and C-spine were ordered in the ED. Her brain MRI revealed an unchanged  distribution of cerebral white matter lesions in a nonspecific pattern. MRI C-spine showed resolution of previously seen enhancement of a C4 level cord lesion which was seen on her study performed 04/09/20. Otherwise, there was unchanged appearance of demyelinating lesions in the cervical spinal cord.   Past Medical History:  Diagnosis Date  . Anemia   . Depression   . Multiple sclerosis (HCC)     Past Surgical History:  Procedure Laterality Date  . WRIST SURGERY Bilateral     Family History  Problem Relation Age of Onset  . Multiple sclerosis Father               Social History:  reports that she has never smoked. She has never used smokeless tobacco. She reports that she does  not drink alcohol and does not use drugs.  No Known Allergies  MEDICATIONS:                                                                                                                     Prior to Admission:  Medications Prior to Admission  Medication Sig Dispense Refill Last Dose  . cholecalciferol (VITAMIN D3) 25 MCG (1000 UNIT) tablet Take 1,000 Units by mouth daily.     . ferrous sulfate 325 (65 FE) MG EC tablet Take 325 mg by mouth daily.     . prenatal vitamin w/FE, FA (PRENATAL 1 + 1) 27-1 MG TABS tablet Take 1 tablet by mouth daily.     . sertraline (ZOLOFT) 100 MG tablet Take 200 mg by mouth daily.     . vitamin B-12 (CYANOCOBALAMIN) 1000 MCG tablet Take 1,000 mcg by mouth daily.     Marland Kitchen Glatiramer Acetate (COPAXONE) 40 MG/ML SOSY Inject 40 mg into the skin 3 (three) times a week.      Scheduled: . insulin aspart  0-5 Units Subcutaneous QHS  . insulin aspart  0-9 Units Subcutaneous TID WC  . pantoprazole  40 mg Oral Daily  . potassium chloride  40 mEq Oral BID  . sertraline  200 mg Oral Daily   Continuous: . sodium chloride    . methylPREDNISolone (SOLU-MEDROL) injection       ROS:                                                                                                                                        States her weakness has significantly improved since receiving supplemental potassium. Other ROS as per HPI. Does not endorse additional symptoms.    Blood pressure 120/62, pulse 94, temperature 98.3 F (36.8 C), temperature source Oral, resp. rate 16, height 5\' 5"  (1.651 m), weight 94.8 kg, last menstrual period 03/31/2020, SpO2 95 %, unknown if currently breastfeeding.   General Examination:  Physical Exam  HEENT-  [place]/AT   Lungs- Respirations unlabored Extremities- No edema  Neurological Examination Mental Status: Alert, fully oriented, thought content appropriate.  Speech fluent without evidence of aphasia.  Able to follow all commands without difficulty. Cranial Nerves: II: Visual fields intact with no extinction to DSS. PERRL; no RAPD.   III,IV, VI: No ptosis. EOMI. No nystagmus.  V,VII: Smile symmetric, facial temp sensation equal bilaterally VIII: Hearing intact to voice IX,X: Phonation intact XI: Symmetric XII: Midline tongue extension Motor: Right : Upper extremity   5/5    Left:     Upper extremity   5/5  Lower extremity   5/5     Lower extremity   5/5 Normal tone throughout; no atrophy noted Sensory: Decreased temp sensation to RLE. Otherwise unremarkable.  Deep Tendon Reflexes: 1+ right biceps and brachioradialis. 2+ left biceps and brachioradialis. 1+ right patellar and achilles. 2+ left patellar and achilles.  Plantars: Right: downgoing   Left: downgoing Cerebellar: No ataxia with FNF bilaterally  Gait: Deferred   Lab Results: Basic Metabolic Panel: Recent Labs  Lab 04/17/20 1828 04/18/20 0320  NA 137 137  K 2.5* 4.5  CL 108 110  CO2 18* 19*  GLUCOSE 262* 206*  BUN 10 6  CREATININE 0.82 0.66  CALCIUM 9.3 8.6*  MG  --  2.1    CBC: Recent Labs  Lab 04/17/20 1828 04/18/20 0320  WBC 9.5 18.0*  NEUTROABS 8.5*  --   HGB 13.5  12.6  HCT 41.9 39.0  MCV 94.6 93.8  PLT 235 235    Cardiac Enzymes: No results for input(s): CKTOTAL, CKMB, CKMBINDEX, TROPONINI in the last 168 hours.  Lipid Panel: No results for input(s): CHOL, TRIG, HDL, CHOLHDL, VLDL, LDLCALC in the last 168 hours.  Imaging: MR Brain W and Wo Contrast  Result Date: 04/17/2020 CLINICAL DATA:  Upper and lower extremity weakness EXAM: MRI HEAD WITHOUT AND WITH CONTRAST MRI CERVICAL SPINE WITHOUT AND WITH CONTRAST TECHNIQUE: Multiplanar, multiecho pulse sequences of the brain and surrounding structures, and cervical spine, to include the craniocervical junction and cervicothoracic junction, were obtained without and with intravenous contrast. CONTRAST:  48mL GADAVIST GADOBUTROL 1 MMOL/ML IV SOLN COMPARISON:  04/09/2020 FINDINGS: MRI HEAD FINDINGS Brain: No acute infarct, mass effect or extra-axial collection. No acute or chronic hemorrhage. There are a few scattered foci of hyperintense T2-weighted signal within the white matter in a nonspecific pattern, unchanged. The midline structures are normal. There is no abnormal contrast enhancement. Vascular: Major flow voids are preserved. Skull and upper cervical spine: Normal calvarium and skull base. Visualized upper cervical spine and soft tissues are normal. Sinuses/Orbits:No paranasal sinus fluid levels or advanced mucosal thickening. No mastoid or middle ear effusion. Normal orbits. MRI CERVICAL SPINE FINDINGS Alignment: Physiologic. Vertebrae: C6 hemangioma Cord: Unchanged lesions within the spinal cord at the C2, C4 and C7 levels. No abnormal contrast enhancement. Posterior Fossa, vertebral arteries, paraspinal tissues: Negative. Disc levels: No spinal canal or neural foraminal stenosis. IMPRESSION: 1. No acute intracranial abnormality. 2. Unchanged distribution of cerebral white matter lesions in a nonspecific pattern. 3. Unchanged appearance of demyelinating lesions in the cervical spinal cord. Previously seen  enhancement of the C4 lesion has resolved. Electronically Signed   By: Deatra Robinson M.D.   On: 04/17/2020 23:21   MR Cervical Spine W or Wo Contrast  Result Date: 04/17/2020 CLINICAL DATA:  Upper and lower extremity weakness EXAM: MRI HEAD WITHOUT AND WITH CONTRAST MRI CERVICAL SPINE WITHOUT AND WITH  CONTRAST TECHNIQUE: Multiplanar, multiecho pulse sequences of the brain and surrounding structures, and cervical spine, to include the craniocervical junction and cervicothoracic junction, were obtained without and with intravenous contrast. CONTRAST:  73mL GADAVIST GADOBUTROL 1 MMOL/ML IV SOLN COMPARISON:  04/09/2020 FINDINGS: MRI HEAD FINDINGS Brain: No acute infarct, mass effect or extra-axial collection. No acute or chronic hemorrhage. There are a few scattered foci of hyperintense T2-weighted signal within the white matter in a nonspecific pattern, unchanged. The midline structures are normal. There is no abnormal contrast enhancement. Vascular: Major flow voids are preserved. Skull and upper cervical spine: Normal calvarium and skull base. Visualized upper cervical spine and soft tissues are normal. Sinuses/Orbits:No paranasal sinus fluid levels or advanced mucosal thickening. No mastoid or middle ear effusion. Normal orbits. MRI CERVICAL SPINE FINDINGS Alignment: Physiologic. Vertebrae: C6 hemangioma Cord: Unchanged lesions within the spinal cord at the C2, C4 and C7 levels. No abnormal contrast enhancement. Posterior Fossa, vertebral arteries, paraspinal tissues: Negative. Disc levels: No spinal canal or neural foraminal stenosis. IMPRESSION: 1. No acute intracranial abnormality. 2. Unchanged distribution of cerebral white matter lesions in a nonspecific pattern. 3. Unchanged appearance of demyelinating lesions in the cervical spinal cord. Previously seen enhancement of the C4 lesion has resolved. Electronically Signed   By: Deatra Robinson M.D.   On: 04/17/2020 23:21    Assessment: 30 year old female with  MS, presenting with acute symmetric weakness of her upper and lower extremities.  1. Exam reveals asymmetric reflexes and RLE sensory deficit. Strength is 5/5 x 4 without asymmetry. The patient states that her weakness is now significantly improved after potassium supplementation.  2. Critically low potassium of 2.5 is felt to be the most likely etiology for her acute symptoms of BUE and BLE weakness following initial Solumedrol dose. Of note, one of the potential side effects of Solumedrol is hypokalemia, dur to its mineralocorticoid activity.   Recommendations: 1. Continue to monitor electrolytes and correct as needed.  2. Monitor CBG and correct as needed with SSI. Most likely secondary to steroid administration, but will need to be evaluated for possible DM as an outpatient.  3. Continue Solumedrol for a total of 3 doses.  4. Would wait additional 24 hours after last Solumedrol dose to ensure that electrolytes and glucose are stable before discharge.  5. Pharmacy at [facility] can obtain Copaxone, but the [facility] supplier for this medication has a Orthoptist cannot guarantee a steady monthly supply. The patient should be advised to continue to attempt to obtain her Copaxone through the pharmacy that she is currently using. If she has Nurse, learning disability (but not [employer]), [employer] has a program to assist some patients with the cost of this medication.  6. Outpatient Neurology follow up.     Electronically signed: Dr. Caryl Pina 04/18/2020, 7:46 AM

## 2020-04-19 DIAGNOSIS — E876 Hypokalemia: Secondary | ICD-10-CM | POA: Diagnosis not present

## 2020-04-19 LAB — GLUCOSE, CAPILLARY
Glucose-Capillary: 106 mg/dL — ABNORMAL HIGH (ref 70–99)
Glucose-Capillary: 114 mg/dL — ABNORMAL HIGH (ref 70–99)

## 2020-04-19 MED ORDER — PANTOPRAZOLE SODIUM 40 MG PO TBEC: 40.0000 mg | DELAYED_RELEASE_TABLET | Freq: Every day | ORAL | 0 refills | Status: AC

## 2020-04-19 NOTE — Discharge Summary (Signed)
Physician Discharge Summary  Kelly FillerHeather David VQQ:[id number] DOB: 03-26-1991 DOA: 04/17/2020  PCP: Kelly ParadiseMcLaughlin, Miriam K, MD  Admit date: 04/17/2020 Discharge date: 04/19/2020  Admitted From: Home Disposition:  Home  Recommendations for Outpatient Follow-up:  1. Follow up with PCP in 1-2 weeks 2. Follow-up with neurology in 1 week 3. Please obtain BMP/CBC in one week 4. Please follow up on the following pending results: None  Home Health: No Equipment/Devices: None Discharge Condition: Stable CODE STATUS: Full Diet recommendation: Heart Healthy   Brief/Interim Summary: Kelly David a 30 y.o.femalewith medical history significant fordepression and multiple sclerosis, now presenting to the emergency department for evaluation of bilateral upper extremity weakness. Patient reports history of [place] findings and history, has never had lumbar puncture, and has not been on any medications since 2018, has been following with Dr. Philbert RiserShahof neurology and reports starting treatment with IV Solu-Medrol in the outpatient setting earlier today for suspected MS flare. She developed right lower extremity paresthesias roughly a month ago, improved some with prednisone taper but the symptoms began to worsen again shortly after completing that. Shedeveloped a headache approximately 2 weeks ago, also reports proximal leg weakness bilaterally,began treatment with IV Solu-Medrol earlier todaywith plan for 3-day course, but then developed bilateral upper extremity weakness this afternoon which prompted her presentation to the ED.she describes weakness mainly in the bilateral hands but also involving the forearms. She denies any fevers, chills, cough, shortness of breath, chest pain, or palpitations. MRI brain and cervical spine without any new changes. Patient received 3 doses of high-dose steroid for MS flare, responded very well.  Symptoms  improved and she appears back to her baseline.  Our neurologist evaluated her and they are recommending starting Copaxone as soon as possible.  Advised patient to discuss with her neurologist so they can facilitate insurance approval.   Discharge Diagnoses:  Principal Problem:   Multiple sclerosis (HCC) Active Problems:   Upper extremity weakness   Hypokalemia   Drug-induced hyperglycemia   Discharge Instructions  Discharge Instructions    Diet - low sodium heart healthy   Complete by: As directed    Discharge instructions   Complete by: As directed    It was pleasure taking care of you. Please follow-up with your neurologist ASAP so they can help you start Copaxone to prevent more flares of your disease.   Increase activity slowly   Complete by: As directed      Allergies as of 04/19/2020   No Known Allergies     Medication List    TAKE these medications   cholecalciferol 25 MCG (1000 UNIT) tablet Commonly known as: VITAMIN D3 Take 1,000 Units by mouth daily.   Copaxone 40 MG/ML Sosy Generic drug: Glatiramer Acetate Inject 40 mg into the skin 3 (three) times a week.   ferrous sulfate 325 (65 FE) MG EC tablet Take 325 mg by mouth daily.   pantoprazole 40 MG tablet Commonly known as: PROTONIX Take 1 tablet (40 mg total) by mouth daily.   prenatal vitamin w/FE, FA 27-1 MG Tabs tablet Take 1 tablet by mouth daily.   sertraline 100 MG tablet Commonly known as: ZOLOFT Take 200 mg by mouth daily.   vitamin B-12 1000 MCG tablet Commonly known as: CYANOCOBALAMIN Take 1,000 mcg by mouth daily.       Follow-up Information    Lonell FaceShah, Hemang K, MD Follow up in 1 week(s).   Specialty: Neurology Contact information: [address] [facility] [place] [address] [phone]              No Known Allergies  Consultations:  Neurology  Procedures/Studies: MR Brain W and Wo Contrast  Result Date: 04/17/2020 CLINICAL DATA:   Upper and lower extremity weakness EXAM: MRI HEAD WITHOUT AND WITH CONTRAST MRI CERVICAL SPINE WITHOUT AND WITH CONTRAST TECHNIQUE: Multiplanar, multiecho pulse sequences of the brain and surrounding structures, and cervical spine, to include the craniocervical junction and cervicothoracic junction, were obtained without and with intravenous contrast. CONTRAST:  3mL GADAVIST GADOBUTROL 1 MMOL/ML IV SOLN COMPARISON:  04/09/2020 FINDINGS: MRI HEAD FINDINGS Brain: No acute infarct, mass effect or extra-axial collection. No acute or chronic hemorrhage. There are a few scattered foci of hyperintense T2-weighted signal within the white matter in a nonspecific pattern, unchanged. The midline structures are normal. There is no abnormal contrast enhancement. Vascular: Major flow voids are preserved. Skull and upper cervical spine: Normal calvarium and skull base. Visualized upper cervical spine and soft tissues are normal. Sinuses/Orbits:No paranasal sinus fluid levels or advanced mucosal thickening. No mastoid or middle ear effusion. Normal orbits. MRI CERVICAL SPINE FINDINGS Alignment: Physiologic. Vertebrae: C6 hemangioma Cord: Unchanged lesions within the spinal cord at the C2, C4 and C7 levels. No abnormal contrast enhancement. Posterior Fossa, vertebral arteries, paraspinal tissues: Negative. Disc levels: No spinal canal or neural foraminal stenosis. IMPRESSION: 1. No acute intracranial abnormality. 2. Unchanged distribution of cerebral white matter lesions in a nonspecific pattern. 3. Unchanged appearance of demyelinating lesions in the cervical spinal cord. Previously seen enhancement of the C4 lesion has resolved. Electronically Signed   By: Deatra Robinson M.D.   On: 04/17/2020 23:21   MR BRAIN W WO CONTRAST  Result Date: 04/09/2020 CLINICAL DATA:  Relapsing remitting multiple sclerosis. Additional history provided: Exacerbation with right lower extremity burning and pins/needles sensation starting 03/15/2020,  currently off of MS meds. EXAM: MRI HEAD WITHOUT AND WITH CONTRAST TECHNIQUE: Multiplanar, multiecho pulse sequences of the brain and surrounding structures were obtained without and with intravenous contrast. CONTRAST:  35mL GADAVIST GADOBUTROL 1 MMOL/ML IV SOLN COMPARISON:  Brain MRI 02/25/2019. FINDINGS: Brain: Cerebral volume is normal. A 3 mm T2/FLAIR hyperintense focus within the anterior left frontal lobe periventricular white matter appears new as compared to the brain MRI of 02/25/2019 (series 16, image 32) (series 15, image 15). No abnormal enhancement to suggest active demyelination. A few small scattered foci of T2/FLAIR hyperintense signal abnormality within the bilateral cerebral white matter are otherwise unchanged. No posterior fossa white matter lesions are identified. There is no acute infarct. No evidence of intracranial mass. No chronic intracranial blood products. No extra-axial fluid collection. No midline shift. No abnormal intracranial enhancement. Vascular: Expected proximal arterial flow voids. Skull and upper cervical spine: No focal marrow lesion Sinuses/Orbits: Visualized orbits show no acute finding. No significant paranasal sinus disease at the imaged levels. IMPRESSION: A 3 mm T2/FLAIR hyperintense focus within the anterior left frontal lobe periventricular white matter is new from the MRI of 02/25/2019. A few small scattered foci of T2/FLAIR hyperintense signal abnormality within the bilateral cerebral white matter are otherwise stable. These foci are nonspecific, but presumably reflect sequela of demyelinating disease given the provided history. No abnormal intracranial enhancement is demonstrated to suggest active demyelination. Otherwise normal MRI appearance of the brain. Electronically Signed   By: Jackey Loge DO   On: 04/09/2020 17:59   MR Cervical Spine W or Wo Contrast  Result Date: 04/17/2020 CLINICAL DATA:  Upper and lower extremity weakness EXAM: MRI HEAD WITHOUT AND  WITH CONTRAST MRI CERVICAL SPINE WITHOUT AND WITH CONTRAST TECHNIQUE: Multiplanar, multiecho pulse sequences of the brain and surrounding structures, and cervical spine, to include the craniocervical junction and cervicothoracic junction, were obtained without and with intravenous contrast. CONTRAST:  17mL GADAVIST GADOBUTROL 1 MMOL/ML IV SOLN COMPARISON:  04/09/2020 FINDINGS: MRI HEAD FINDINGS Brain: No acute infarct, mass effect or extra-axial collection. No acute or chronic hemorrhage. There are a few scattered foci of hyperintense T2-weighted signal within the white matter in a nonspecific pattern, unchanged. The midline structures are normal. There is no abnormal contrast enhancement. Vascular: Major flow voids are preserved. Skull and upper cervical spine: Normal calvarium and skull base. Visualized upper cervical spine and soft tissues are normal. Sinuses/Orbits:No paranasal sinus fluid levels or advanced mucosal thickening. No mastoid or middle ear effusion. Normal orbits. MRI CERVICAL SPINE FINDINGS Alignment: Physiologic. Vertebrae: C6 hemangioma Cord: Unchanged lesions within the spinal cord at the C2, C4 and C7 levels. No abnormal contrast enhancement. Posterior Fossa, vertebral arteries, paraspinal tissues: Negative. Disc levels: No spinal canal or neural foraminal stenosis. IMPRESSION: 1. No acute intracranial abnormality. 2. Unchanged distribution of cerebral white matter lesions in a nonspecific pattern. 3. Unchanged appearance of demyelinating lesions in the cervical spinal cord. Previously seen enhancement of the C4 lesion has resolved. Electronically Signed   By: Deatra Robinson M.D.   On: 04/17/2020 23:21   MR CERVICAL SPINE W WO CONTRAST  Addendum Date: 04/09/2020   ADDENDUM REPORT: 04/09/2020 18:34 ADDENDUM: Impression #1 will be called to the ordering clinician or representative by the Radiologist Assistant, and communication documented in the PACS or [employer]. Electronically Signed    By: Jackey Loge DO   On: 04/09/2020 18:34   Result Date: 04/09/2020 CLINICAL DATA:  Relapsing remitting multiple sclerosis. Additional history provided: Exacerbation with right lower extremity burning and pins and needles sensation beginning 03/15/2020, currently off of multiple sclerosis meds. EXAM: MRI CERVICAL SPINE WITHOUT AND WITH CONTRAST TECHNIQUE: Multiplanar and multiecho pulse sequences of the cervical spine, to include the craniocervical junction and cervicothoracic junction, were obtained without and with intravenous contrast. CONTRAST:  25mL GADAVIST GADOBUTROL 1 MMOL/ML IV SOLN COMPARISON:  MRI of the cervical spine 02/25/2019. FINDINGS: Alignment: Straightening of the expected cervical lordosis. No significant spondylolisthesis. Vertebrae: Vertebral body height is maintained. No significant marrow edema or focal suspicious osseous lesion. Redemonstrated vertebral body hemangiomas within the C6 and T3 vertebrae. Cord: 6 mm focus of T2/STIR hyperintense signal abnormality within the ventral cord at the C2 level, new from the prior MRI of 02/25/2019 (for instance as seen on series 1, image 8) (series 3, image 8). A known small lesion within the left dorsal cord at the C3 level was better appreciated on the prior MRI. There is a new T2/STIR hyperintense lesion within the right posterior cord at the C4 level (for instance as seen on series 4, images 9 and 10). There is a new T2/STIR hyperintense lesion within the left aspect of the cord at the C4 level (series 4, image 9) (series 5, image 9). Subtle enhancement is also present at this site (series 11, image 10) (series 12, image 9). Redemonstrated T2/STIR hyperintense foci within the right and left hemi-cords at the C5 level. Redemonstrated T2/STIR hyperintense signal abnormality within the left hemicord at the C6-C7 level. Posterior Fossa, vertebral arteries, paraspinal tissues: Posterior fossa better assessed on the concurrently performed brain MRI.  Flow voids preserved within the imaged  cervical vertebral arteries. Paraspinal soft tissues within normal limits. 13 mm right thyroid lobe nodule (series 3, image 3). Disc levels: Unless otherwise stated, the level by level findings below have not significantly changed since prior MRI 02/25/2019. No more than mild disc degeneration at any level. C2-C3: No significant disc herniation or stenosis. C3-C4: Mild disc bulge and uncovertebral hypertrophy. No significant spinal canal or foraminal stenosis. C4-C5: Shallow disc bulge asymmetric to the left. Mild uncovertebral hypertrophy on the left. No significant spinal canal stenosis or neural foraminal narrowing. C5-C6: Small right center disc protrusion. Partial effacement of the ventral thecal sac with mild relative spinal canal narrowing. No significant foraminal stenosis. C6-C7: No significant disc herniation or stenosis. C7-T1: No significant disc herniation or stenosis. IMPRESSION: There are T2/STIR hyperintense lesions within the ventral spinal cord at the C2 level, within the dorsal right cord at the C4 level and within the left aspect of the cord at the C4 level which are new from the MRI of 02/25/2019. There is subtle corresponding enhancement within the left cord at the C4 level and this likely reflects a site of active/recent demyelination. Multiple cervical spinal cord lesions are otherwise stable. Stable cervical spondylosis. No more than mild relative spinal canal narrowing. No significant foraminal stenosis. Incidentally noted 13 mm right thyroid lobe nodule. A nonemergent thyroid ultrasound is recommended for further evaluation. Electronically Signed: By: Jackey Loge DO On: 04/09/2020 18:19   MR THORACIC SPINE W WO CONTRAST  Result Date: 04/09/2020 CLINICAL DATA:  Relapsing remitting multiple sclerosis. Additional history provided: Exacerbation with right lower extremity burning and pins and needle sensation beginning 03/15/2020. Patient currently not  taking MS meds. EXAM: MRI THORACIC WITHOUT AND WITH CONTRAST TECHNIQUE: Multiplanar and multiecho pulse sequences of the thoracic spine were obtained without and with intravenous contrast. CONTRAST:  82mL GADAVIST GADOBUTROL 1 MMOL/ML IV SOLN COMPARISON:  Same day cervical spine MRI 04/09/2020. Prior cervical spine MRI 02/25/2019. FINDINGS: Mildly motion degraded exam. Alignment: No significant spondylolisthesis. Vertebrae: Vertebral body height is maintained. No significant marrow edema or focal suspicious osseous lesion. T3 vertebral body hemangioma. Small T11-T12 Schmorl nodes. Cord: There is a T2 hyperintense lesion within the dorsal right cord at the C6-C7 level which is better appreciated on the current exam. This appears new as compared to the cervical spine MRI of 02/25/2019. Within limitations of mild motion degradation, no definite signal abnormality or abnormal enhancement is identified within the thoracic spinal cord. Paraspinal and other soft tissues: No abnormality is identified within included portions of the thorax or upper abdomen/retroperitoneum. Disc levels: Intervertebral disc height and hydration are maintained throughout the thoracic spine. Small T11-T12 disc bulge. No significant spinal canal or foraminal stenosis at any level. IMPRESSION: T2 hyperintense lesion within the dorsal right cord at the C6-C7 level, which appears new from the cervical spine MRI of 02/25/2019. Within the limitations of mild motion degradation, no definite signal abnormality or abnormal enhancement is identified within the thoracic spinal cord. Minimal thoracic spondylosis. No significant spinal canal or foraminal narrowing at any level. Electronically Signed   By: Jackey Loge DO   On: 04/09/2020 18:31     Subjective: Patient was feeling better when seen today.  Ready to go home.  Appears back to her baseline.  All of her weakness symptoms resolved.  Discharge Exam: Vitals:   04/19/20 0612 04/19/20 0736  BP:  115/77 114/67  Pulse: 85 75  Resp:  18  Temp: 98.3 F (36.8 C) 98 F (36.7 C)  SpO2: 97% 96%   Vitals:   04/18/20 1650 04/18/20 2033 04/19/20 0612 04/19/20 0736  BP: 116/65 129/65 115/77 114/67  Pulse: 94 92 85 75  Resp: 16   18  Temp: 98.5 F (36.9 C) 98.2 F (36.8 C) 98.3 F (36.8 C) 98 F (36.7 C)  TempSrc: Oral     SpO2: 96% 96% 97% 96%  Weight:      Height:        General: Pt is alert, awake, not in acute distress Cardiovascular: RRR, S1/S2 +, no rubs, no gallops Respiratory: CTA bilaterally, no wheezing, no rhonchi Abdominal: Soft, NT, ND, bowel sounds + Extremities: no edema, no cyanosis   The results of significant diagnostics from this hospitalization (including imaging, microbiology, ancillary and laboratory) are listed below for reference.    Microbiology: Recent Results (from the past 240 hour(s))  SARS CORONAVIRUS 2 (TAT 6-24 HRS) Nasopharyngeal Nasopharyngeal Swab     Status: None   Collection Time: 04/17/20  8:15 PM   Specimen: Nasopharyngeal Swab  Result Value Ref Range Status   SARS Coronavirus 2 NEGATIVE NEGATIVE Final    Comment: (NOTE) SARS-CoV-2 target nucleic acids are NOT DETECTED.  The SARS-CoV-2 RNA is generally detectable in upper and lower respiratory specimens during the acute phase of infection. Negative results do not preclude SARS-CoV-2 infection, do not rule out co-infections with other pathogens, and should not be used as the sole basis for treatment or other patient management decisions. Negative results must be combined with clinical observations, patient history, and epidemiological information. The expected result is Negative.  Fact Sheet for Patients: HairSlick.no  Fact Sheet for Healthcare Providers: quierodirigir.com  This test is not yet approved or cleared by the [place] FDA and  has been authorized for detection and/or diagnosis of SARS-CoV-2 by FDA under  an Emergency Use Authorization (EUA). This EUA will remain  in effect (meaning this test can be used) for the duration of the COVID-19 declaration under Se ction 564(b)(1) of the Act, 21 U.S.C. section 360bbb-3(b)(1), unless the authorization is terminated or revoked sooner.  Performed at [facility], [address] [place], [place] [address]      Labs: BNP (last 3 results) No results for input(s): BNP in the last 8760 hours. Basic Metabolic Panel: Recent Labs  Lab 04/17/20 1828 04/18/20 0320  NA 137 137  David 2.5* 4.5  CL 108 110  CO2 18* 19*  GLUCOSE 262* 206*  BUN 10 6  CREATININE 0.82 0.66  CALCIUM 9.3 8.6*  MG  --  2.1   Liver Function Tests: Recent Labs  Lab 04/17/20 1828  AST 36  ALT 21  ALKPHOS 84  BILITOT 0.6  PROT 8.0  ALBUMIN 4.2   No results for input(s): LIPASE, AMYLASE in the last 168 hours. No results for input(s): AMMONIA in the last 168 hours. CBC: Recent Labs  Lab 04/17/20 1828 04/18/20 0320  WBC 9.5 18.0*  NEUTROABS 8.5*  --   HGB 13.5 12.6  HCT 41.9 39.0  MCV 94.6 93.8  PLT 235 235   Cardiac Enzymes: No results for input(s): CKTOTAL, CKMB, CKMBINDEX, TROPONINI in the last 168 hours. BNP: Invalid input(s): POCBNP CBG: Recent Labs  Lab 04/18/20 0827 04/18/20 1202 04/18/20 1651 04/18/20 2107 04/19/20 0737  GLUCAP 115* 151* 165* 196* 106*   D-Dimer No results for input(s): DDIMER in the last 72 hours. Hgb A1c Recent Labs    04/18/20 0320  HGBA1C 5.0   Lipid Profile No results  for input(s): CHOL, HDL, LDLCALC, TRIG, CHOLHDL, LDLDIRECT in the last 72 hours. Thyroid function studies No results for input(s): TSH, T4TOTAL, T3FREE, THYROIDAB in the last 72 hours.  Invalid input(s): FREET3 Anemia work up No results for input(s): VITAMINB12, FOLATE, FERRITIN, TIBC, IRON, RETICCTPCT in the last 72 hours. Urinalysis No results found for: COLORURINE, APPEARANCEUR, LABSPEC, PHURINE, GLUCOSEU, HGBUR, BILIRUBINUR, KETONESUR,  PROTEINUR, UROBILINOGEN, NITRITE, LEUKOCYTESUR Sepsis Labs Invalid input(s): PROCALCITONIN,  WBC,  LACTICIDVEN Microbiology Recent Results (from the past 240 hour(s))  SARS CORONAVIRUS 2 (TAT 6-24 HRS) Nasopharyngeal Nasopharyngeal Swab     Status: None   Collection Time: 04/17/20  8:15 PM   Specimen: Nasopharyngeal Swab  Result Value Ref Range Status   SARS Coronavirus 2 NEGATIVE NEGATIVE Final    Comment: (NOTE) SARS-CoV-2 target nucleic acids are NOT DETECTED.  The SARS-CoV-2 RNA is generally detectable in upper and lower respiratory specimens during the acute phase of infection. Negative results do not preclude SARS-CoV-2 infection, do not rule out co-infections with other pathogens, and should not be used as the sole basis for treatment or other patient management decisions. Negative results must be combined with clinical observations, patient history, and epidemiological information. The expected result is Negative.  Fact Sheet for Patients: HairSlick.no  Fact Sheet for Healthcare Providers: quierodirigir.com  This test is not yet approved or cleared by the [place] FDA and  has been authorized for detection and/or diagnosis of SARS-CoV-2 by FDA under an Emergency Use Authorization (EUA). This EUA will remain  in effect (meaning this test can be used) for the duration of the COVID-19 declaration under Se ction 564(b)(1) of the Act, 21 U.S.C. section 360bbb-3(b)(1), unless the authorization is terminated or revoked sooner.  Performed at [facility], [address], [place], [place] [address]     Time coordinating discharge: Over 30 minutes  SIGNED:  Arnetha Courser, MD  [facility] 04/19/2020, 10:25 AM  If 7PM-7AM, please contact night-coverage [email or web address]  This record has been created using Conservation officer, historic buildings. Errors have been sought and corrected,but may not always be  located. Such creation errors do not reflect on the standard of care.

## 2020-06-14 ENCOUNTER — Other Ambulatory Visit: Payer: Self-pay

## 2020-06-14 ENCOUNTER — Emergency Department
Admission: EM | Admit: 2020-06-14 | Discharge: 2020-06-14 | Disposition: A | Discharge status: Home / Self Care | Payer: BC Managed Care – PPO | Attending: Emergency Medicine | Admitting: Emergency Medicine

## 2020-06-14 DIAGNOSIS — R200 Anesthesia of skin: Secondary | ICD-10-CM | POA: Insufficient documentation

## 2020-06-14 DIAGNOSIS — R202 Paresthesia of skin: Secondary | ICD-10-CM | POA: Insufficient documentation

## 2020-06-14 DIAGNOSIS — E876 Hypokalemia: Secondary | ICD-10-CM | POA: Diagnosis not present

## 2020-06-14 LAB — BASIC METABOLIC PANEL
Anion gap: 10 (ref 5–15)
BUN: 12 mg/dL (ref 6–20)
CO2: 17 mmol/L — ABNORMAL LOW (ref 22–32)
Calcium: 9.2 mg/dL (ref 8.9–10.3)
Chloride: 110 mmol/L (ref 98–111)
Creatinine, Ser: 0.8 mg/dL (ref 0.44–1.00)
GFR, Estimated: >60 mL/min (ref >60)
Glucose, Bld: 216 mg/dL — ABNORMAL HIGH (ref 70–99)
Potassium: 3.4 mmol/L — ABNORMAL LOW (ref 3.5–5.1)
Sodium: 137 mmol/L (ref 135–145)

## 2020-06-14 LAB — MAGNESIUM: Magnesium: 1.7 mg/dL (ref 1.7–2.4)

## 2020-06-14 LAB — CBC
HCT: 41.2 % (ref 36.0–46.0)
Hemoglobin: 13.7 g/dL (ref 12.0–15.0)
MCH: 30.6 pg (ref 26.0–34.0)
MCHC: 33.3 g/dL (ref 30.0–36.0)
MCV: 92.2 fL (ref 80.0–100.0)
Platelets: 267 10*3/uL (ref 150–400)
RBC: 4.47 MIL/uL (ref 3.87–5.11)
RDW: 14.5 % (ref 11.5–15.5)
WBC: 13.4 10*3/uL — ABNORMAL HIGH (ref 4.0–10.5)
nRBC: 0 % (ref 0.0–0.2)

## 2020-06-14 MED ORDER — POTASSIUM CHLORIDE CRYS ER 20 MEQ PO TBCR
40.0000 meq | EXTENDED_RELEASE_TABLET | Freq: Once | ORAL | Status: AC
  Administered 2020-06-14: 40 meq via ORAL
  Filled 2020-06-14: qty 2

## 2020-06-14 NOTE — ED Notes (Signed)
ED Provider at bedside. 

## 2020-06-14 NOTE — ED Triage Notes (Addendum)
Pt comes with c/o low K+. Pt states she had an infusion completed earlier today for her MS and was later called stating that her K+ was low.  Pt states she was advised to come and get checked out.  Pt states several other labs were abnormal

## 2020-06-14 NOTE — ED Notes (Addendum)
Pt reports having an infusion of Ocrevus this morning and had benadryl and solumedrol with infusion. Pt sts that after infusion she started having L hand weakness then R hand weakness, then bilateral leg weakness. Pt sts symptoms have improved, but not resolved in hands.  Pt was referred to [facility] for blood work and then called a nurse at the infusion center and was told to follow up in the ER due to abnormal lab results.  Pt denies chest pain, dizziness, SOB, N/V/D, tingling, facial/mouth swelling, or other c/o.

## 2020-06-16 NOTE — ED Provider Notes (Signed)
[facility] Emergency Department Provider Note   ____________________________________________   Event Date/Time   First MD Initiated Contact with Patient 06/14/20 2135     (approximate)  I have reviewed the triage vital signs and the nursing notes.   HISTORY  Chief Complaint Low K+    HPI Kelly David is a 30 y.o. female history of multiple sclerosis and depression  Patient reports she had infusion of her MS medication today as well as with Solu-Medrol.  Not long after the infusion she began experience feeling of numbness and  tingling in her hands and arms.  She felt this briefly like her legs felt a little weak.  She went to the clinic had labs drawn and her potassium was low and was referred to the ER for evaluation  Reports is actually almost same symptoms happened last time when she had an infusion and she actually had to be hospitalized briefly, but her symptoms and weakness and numbness seem to have gone away now.  She is already starting to feel better.  No fevers no chest pain no trouble breathing.  She is able to walk normally now, feels much better.  Past Medical History:  Diagnosis Date  . Anemia   . Depression   . Multiple sclerosis [facility])     Patient Active Problem List   Diagnosis Date Noted  . Upper extremity weakness 04/17/2020  . Hypokalemia 04/17/2020  . Drug-induced hyperglycemia 04/17/2020  . Labor and delivery indication for care or intervention 08/10/2018  . Depression 08/10/2018  . Multiple sclerosis (HCC) 08/10/2018  . Anemia affecting pregnancy 08/10/2018    Past Surgical History:  Procedure Laterality Date  . WRIST SURGERY Bilateral     Prior to Admission medications   Medication Sig Start Date End Date Taking? Authorizing Provider  cholecalciferol (VITAMIN D3) 25 MCG (1000 UNIT) tablet Take 1,000 Units by mouth daily.    [provider]  ferrous sulfate 325 (65 FE) MG EC tablet Take 325 mg by mouth  daily.    [provider]  Glatiramer Acetate (COPAXONE) 40 MG/ML SOSY Inject 40 mg into the skin 3 (three) times a week.    [provider]  pantoprazole (PROTONIX) 40 MG tablet Take 1 tablet (40 mg total) by mouth daily. 04/19/20   Arnetha Courser, MD  prenatal vitamin w/FE, FA (PRENATAL 1 + 1) 27-1 MG TABS tablet Take 1 tablet by mouth daily.    [provider]  sertraline (ZOLOFT) 100 MG tablet Take 200 mg by mouth daily.    [provider]  vitamin B-12 (CYANOCOBALAMIN) 1000 MCG tablet Take 1,000 mcg by mouth daily.    [provider]    Allergies Patient has no known allergies.  Family History  Problem Relation Age of Onset  . Multiple sclerosis Father     Social History Social History   Tobacco Use  . Smoking status: Never Smoker  . Smokeless tobacco: Never Used  Substance Use Topics  . Alcohol use: Never  . Drug use: Never    Review of Systems Constitutional: No fever/chills Eyes: No visual changes. ENT: No sore throat. Cardiovascular: Denies chest pain. Respiratory: Denies shortness of breath. Gastrointestinal: No abdominal pain.   Genitourinary: Negative for dysuria. Musculoskeletal: Negative for back pain. Skin: Negative for rash. Neurological: Negative for headaches.  Had a brief sensation of weakness or numbness in her legs and hands, but this is gone away completely now.  Feels much better back to normal.  ____________________________________________   PHYSICAL EXAM:  VITAL SIGNS: ED Triage Vitals  Enc Vitals Group     BP 06/14/20 1829 130/81     Pulse Rate 06/14/20 1829 (!) 103     Resp 06/14/20 1829 17     Temp 06/14/20 1829 98.4 F (36.9 C)     Temp Source 06/14/20 1829 Oral     SpO2 06/14/20 1830 100 %     Weight 06/14/20 2135 212 lb (96.2 kg)     Height 06/14/20 2135 5\' 5"  (1.651 m)     Head Circumference --      Peak Flow --      Pain Score 06/14/20 1826 0     Pain Loc --      Pain Edu? --       Excl. in GC? --     Constitutional: Alert and oriented. Well appearing and in no acute distress. Eyes: Conjunctivae are normal. Head: Atraumatic. Nose: No congestion/rhinnorhea. Mouth/Throat: Mucous membranes are moist. Neck: No stridor.  Cardiovascular: Normal rate, regular rhythm. Grossly normal heart sounds.  Good peripheral circulation. Respiratory: Normal respiratory effort.  No retractions. Lungs CTAB. Gastrointestinal: Soft and nontender. No distention. Musculoskeletal: No lower extremity tenderness nor edema. Neurologic:  Normal speech and language. No gross focal neurologic deficits are appreciated.  Patient able to ambulate back and forth in the hallway with normal gait normal strength without distress.  She does tell me though that all the symptoms she was experiencing of numbness and fatigue after the infusion seem to be gone now Skin:  Skin is warm, dry and intact. No rash noted. Psychiatric: Mood and affect are normal. Speech and behavior are normal.  ____________________________________________   LABS (all labs ordered are listed, but only abnormal results are displayed)  Labs Reviewed  CBC - Abnormal; Notable for the following components:      Result Value   WBC 13.4 (*)    All other components within normal limits  BASIC METABOLIC PANEL - Abnormal; Notable for the following components:   Potassium 3.4 (*)    CO2 17 (*)    Glucose, Bld 216 (*)    All other components within normal limits  MAGNESIUM   ____________________________________________  EKG  ED ECG REPORT I, 08/14/20, the attending physician, personally viewed and interpreted this ECG.  Date: 06/16/2020 EKG Time: 2150 Rate: 80 Rhythm: normal sinus rhythm QRS Axis: normal Intervals: normal ST/T Wave abnormalities: normal Narrative Interpretation: no evidence of acute  ischemia  ____________________________________________  RADIOLOGY   ____________________________________________   PROCEDURES  Procedure(s) performed: None  Procedures  Critical Care performed: No  ____________________________________________   INITIAL IMPRESSION / ASSESSMENT AND PLAN / ED COURSE  Pertinent labs & imaging results that were available during my care of the patient were reviewed by me and considered in my medical decision making (see chart for details).   Comparison experienced a brief episode of fatigue and paresthesias after receiving infusion today.  She reports similar though much more severe with the previous injection.  She has a very reassuring exam her symptoms have abated.  She does have mild hypokalemia here, more notable on labs drawn in the [facility] system.  Will replete with oral potassium, magnesium checked and appropriate.  Has mild leukocytosis, but no associated infectious symptoms.  EKG reassuring normal conduction.  Patient feeling well without complaints or concerns.  Appears appropriate for outpatient follow-up, will follow up with her neurologist.  I did also encourage her to set up follow-up for  recheck of her potassium this coming week as well.  Return precautions and treatment recommendations and follow-up discussed with the patient who is agreeable with the plan.  Vitals:   06/14/20 2135 06/14/20 2313  BP: 115/77 120/81  Pulse: 85 91  Resp: 16 16  Temp:    SpO2: 96% 95%         ____________________________________________   FINAL CLINICAL IMPRESSION(S) / ED DIAGNOSES  Final diagnoses:  Hypokalemia  Paresthesias        Note:  This document was prepared using Dragon voice recognition software and may include unintentional dictation errors       Sharyn Creamer, MD 06/16/20 [phone]

## 2020-08-09 DIAGNOSIS — O09893 Supervision of other high risk pregnancies, third trimester: Secondary | ICD-10-CM | POA: Insufficient documentation

## 2020-08-14 ENCOUNTER — Other Ambulatory Visit: Payer: Self-pay | Admitting: Obstetrics and Gynecology

## 2020-08-14 DIAGNOSIS — O200 Threatened abortion: Secondary | ICD-10-CM

## 2020-08-14 NOTE — Progress Notes (Signed)
Threatened Abortion with spotting and cramping in early preg. LMP 06/21/20

## 2020-08-15 ENCOUNTER — Other Ambulatory Visit: Payer: Self-pay | Admitting: Obstetrics and Gynecology

## 2020-08-15 ENCOUNTER — Other Ambulatory Visit: Payer: Self-pay

## 2020-08-15 ENCOUNTER — Ambulatory Visit
Admission: RE | Admit: 2020-08-15 | Discharge: 2020-08-15 | Disposition: A | Discharge status: Home / Self Care | Payer: BC Managed Care – PPO | Source: Ambulatory Visit | Attending: Obstetrics and Gynecology | Admitting: Obstetrics and Gynecology

## 2020-08-15 DIAGNOSIS — O200 Threatened abortion: Secondary | ICD-10-CM

## 2020-08-15 IMAGING — US US OB < 14 WEEKS - US OB TV
2 series · 15 of 28 positions shown · non-contrast
Comparison: None.

CLINICAL DATA: Threatened abortion.

EXAM:
OBSTETRIC <14 WK US AND TRANSVAGINAL OB US
TECHNIQUE: Both transabdominal and transvaginal ultrasound examinations were
performed for complete evaluation of the gestation as well as the
maternal uterus, adnexal regions, and pelvic cul-de-sac.
Transvaginal technique was performed to assess early pregnancy.

[Series 1: us ob comp less 14 wks · 6 of 57 slices shown (1 of 2)]
[im 1/57]
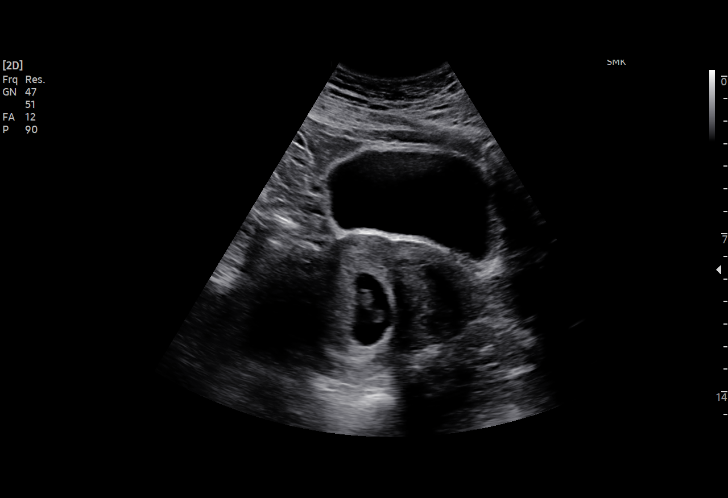
[im 12/57]
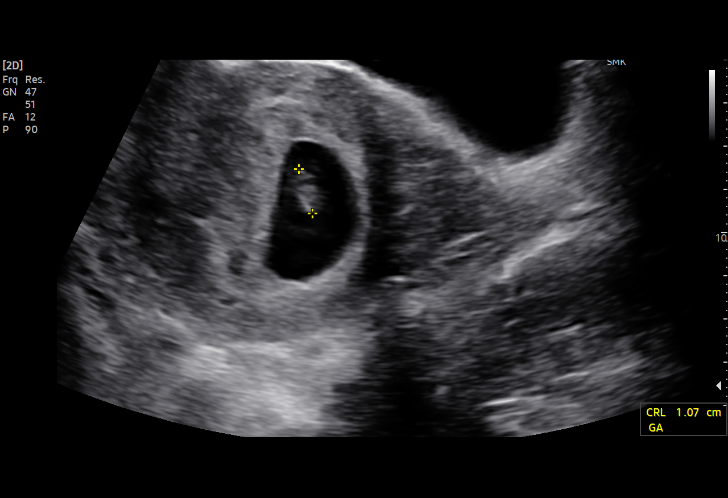
[im 23/57]
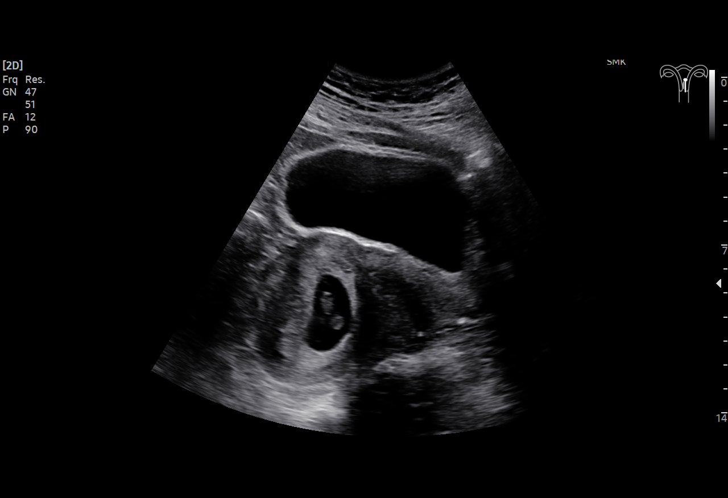
[im 34/57]
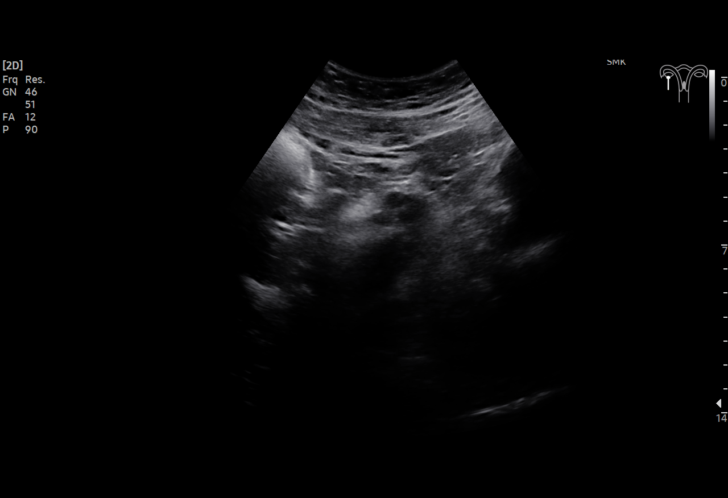
[im 45/57]
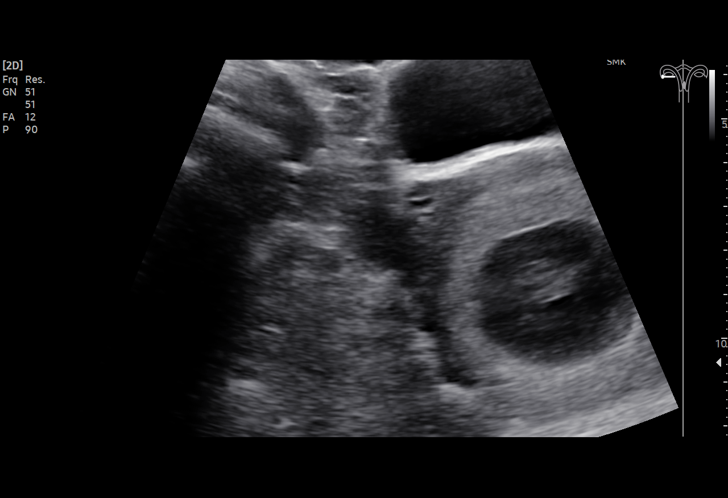
[im 57/57]
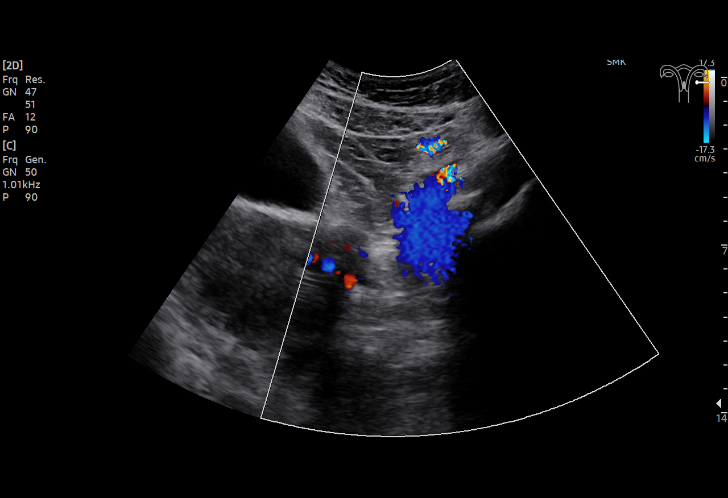

[Series 2: us ob comp less 14 wks · 9 of 92 slices shown (2 of 2)]
[im 6/92]
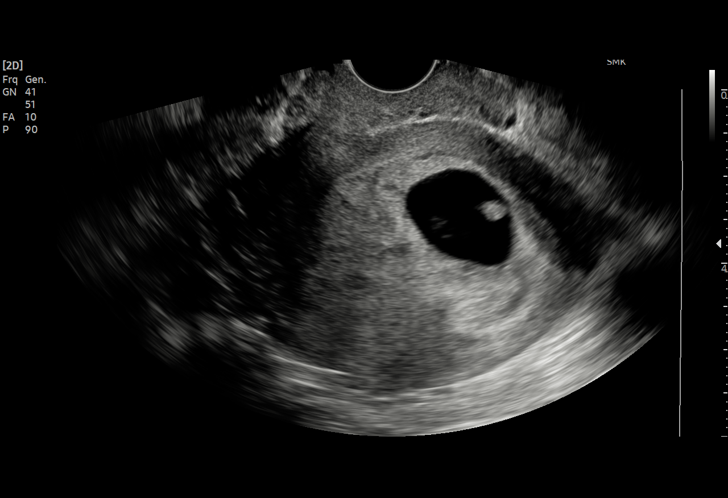
[im 18/92]
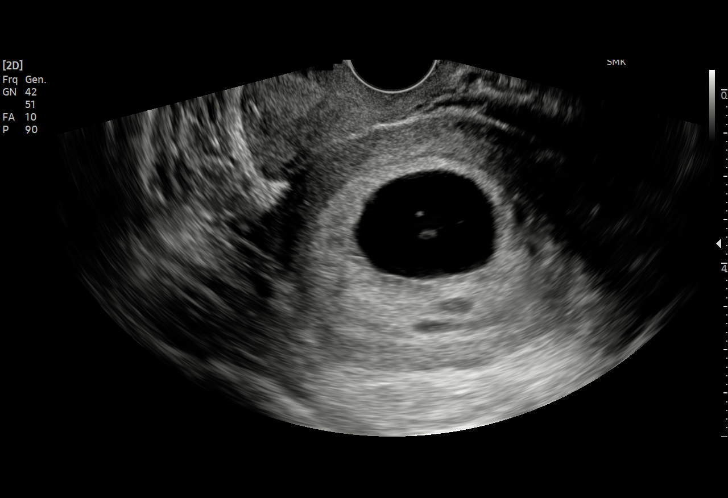
[im 23/92]
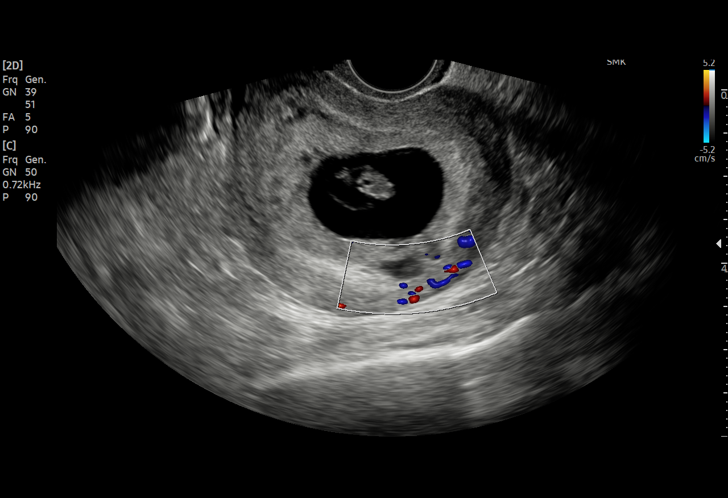
[im 35/92]
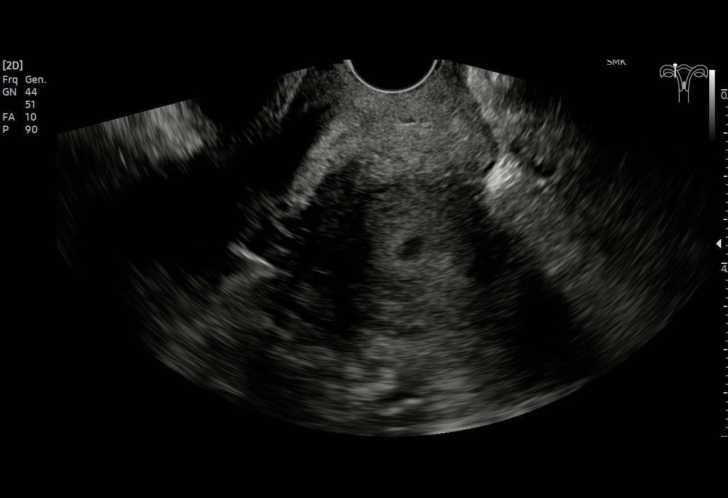
[im 46/92]
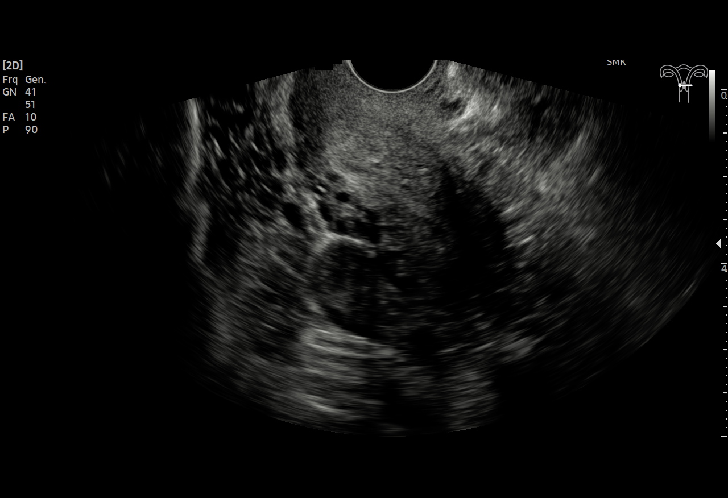
[im 57/92]
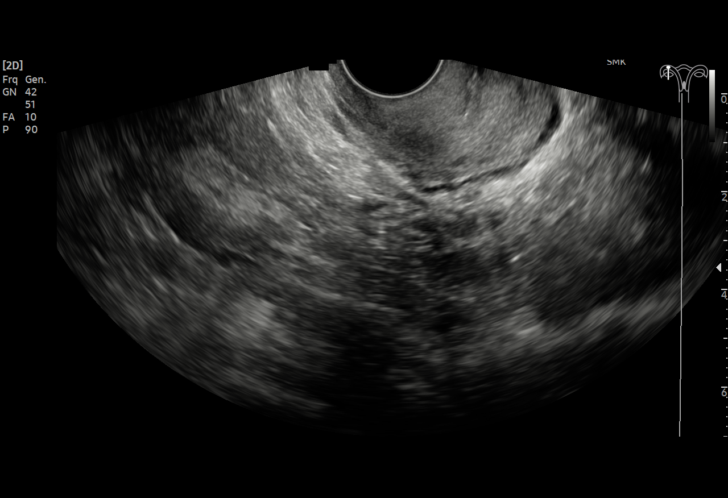
[im 69/92]
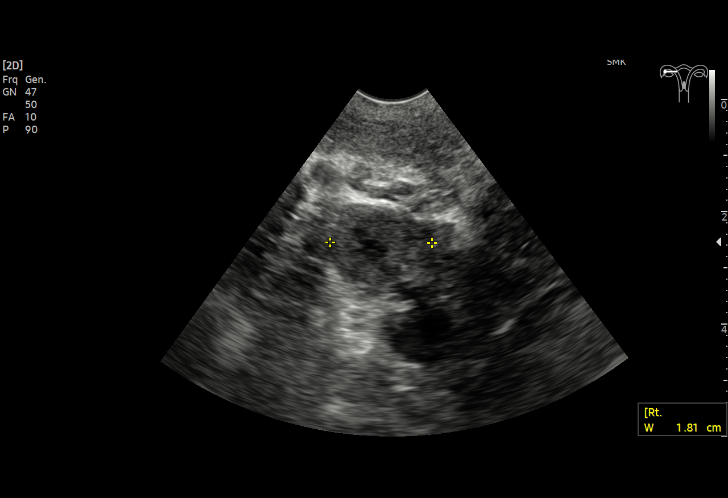
[im 80/92]
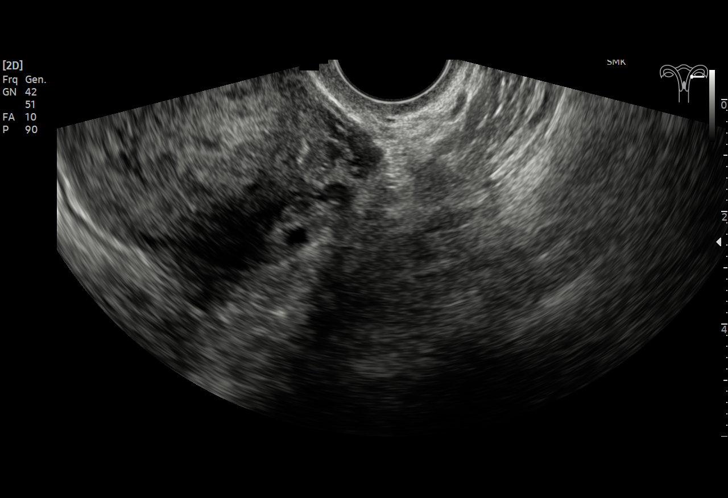
[im 92/92]
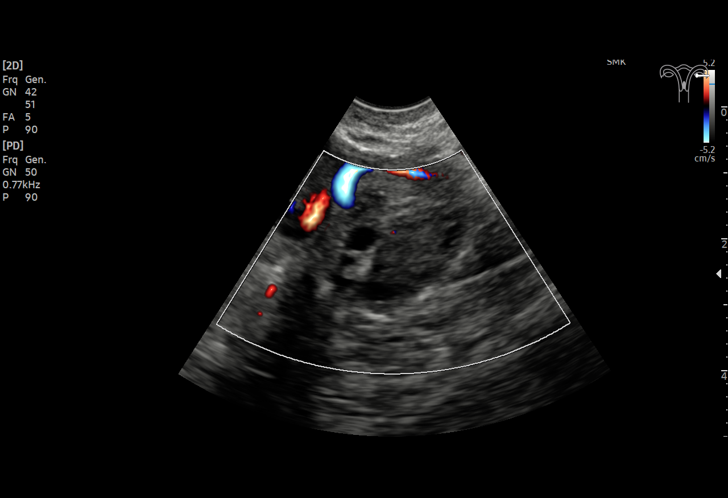

[15 of 28 positions shown; findings below may reference images not displayed]

FINDINGS: Intrauterine gestational sac: Single

Yolk sac:  Visualized.

Embryo:  Visualized.

Cardiac Activity: Visualized.

Heart Rate: 159 bpm

CRL: 13.4 mm   7 w   4 d                  US EDC: [DATE]

Subchorionic hemorrhage:  There is a small subchorionic hemorrhage.

Maternal uterus/adnexae: Normal.

No free fluid is seen.
IMPRESSION: Single live intrauterine pregnancy corresponding to 7 weeks and 4
days gestation with a small subchorionic hemorrhage.

## 2020-09-28 LAB — OB RESULTS CONSOLE VARICELLA ZOSTER ANTIBODY, IGG: Varicella: IMMUNE

## 2020-09-28 LAB — OB RESULTS CONSOLE HEPATITIS B SURFACE ANTIGEN: Hepatitis B Surface Ag: NEGATIVE

## 2020-09-28 LAB — OB RESULTS CONSOLE RUBELLA ANTIBODY, IGM: Rubella: IMMUNE

## 2020-09-28 LAB — OB RESULTS CONSOLE HIV ANTIBODY (ROUTINE TESTING): HIV: NONREACTIVE

## 2021-03-07 LAB — OB RESULTS CONSOLE RPR: RPR: NONREACTIVE

## 2021-03-07 LAB — OB RESULTS CONSOLE GBS: GBS: POSITIVE

## 2021-03-07 LAB — OB RESULTS CONSOLE GC/CHLAMYDIA
Chlamydia: NEGATIVE
Gonorrhea: NEGATIVE

## 2021-03-11 ENCOUNTER — Other Ambulatory Visit: Payer: Self-pay | Admitting: Obstetrics and Gynecology

## 2021-03-11 NOTE — Progress Notes (Signed)
Kelly David is a 30 y.o. G60P2002 female at [redacted]w[redacted]d.  She presents to L&D for IOL for LGA.  Pregnancy Issues: 1. Multiple Sclerosis- followed by [facility] 2. Hx depression 3. Obesity, BMI 35.3 4. Small [facility] per [facility] [place] 08/15/20- RESOLVED 5. Anemia at 28 weeks 6. Uterine S>D   Prenatal care site: [facility]   EFW: 96th%ile   Pertinent Results:  Prenatal Labs: Blood type/Rh O pos  Antibody screen neg  Rubella Immune  Varicella Immune  RPR NR  HBsAg Neg  HIV NR  GC neg  Chlamydia neg  Genetic screening   1 hour GTT 131  3 hour GTT   GBS Pos    5. Post Partum Planning: - Infant feeding: TBD - Contraception: TBD - Tdap Given 01/09/21 - Flu Declined 01/24/21  Vidyuth Belsito, CNM 03/11/2021 4:16 PM

## 2021-03-21 ENCOUNTER — Other Ambulatory Visit: Payer: Self-pay

## 2021-03-21 ENCOUNTER — Other Ambulatory Visit
Admission: RE | Admit: 2021-03-21 | Discharge: 2021-03-21 | Disposition: A | Discharge status: Home / Self Care | Payer: BC Managed Care – PPO | Source: Ambulatory Visit | Attending: Certified Nurse Midwife | Admitting: Certified Nurse Midwife

## 2021-03-21 DIAGNOSIS — Z01812 Encounter for preprocedural laboratory examination: Secondary | ICD-10-CM | POA: Insufficient documentation

## 2021-03-21 DIAGNOSIS — Z20822 Contact with and (suspected) exposure to covid-19: Secondary | ICD-10-CM | POA: Insufficient documentation

## 2021-03-21 DIAGNOSIS — Z01818 Encounter for other preprocedural examination: Secondary | ICD-10-CM

## 2021-03-22 LAB — SARS CORONAVIRUS 2 (TAT 6-24 HRS): SARS Coronavirus 2: NEGATIVE

## 2021-03-23 ENCOUNTER — Inpatient Hospital Stay
Admission: EM | Admit: 2021-03-23 | Discharge: 2021-03-25 | DRG: 806 | Disposition: A | Discharge status: Home / Self Care | Payer: BC Managed Care – PPO

## 2021-03-23 ENCOUNTER — Encounter: Payer: Self-pay | Admitting: Anesthesiology

## 2021-03-23 ENCOUNTER — Inpatient Hospital Stay: Payer: BC Managed Care – PPO | Admitting: Anesthesiology

## 2021-03-23 ENCOUNTER — Encounter: Payer: Self-pay | Admitting: Obstetrics and Gynecology

## 2021-03-23 ENCOUNTER — Other Ambulatory Visit: Payer: Self-pay

## 2021-03-23 DIAGNOSIS — G35 Multiple sclerosis: Secondary | ICD-10-CM | POA: Diagnosis present

## 2021-03-23 DIAGNOSIS — O26843 Uterine size-date discrepancy, third trimester: Secondary | ICD-10-CM | POA: Diagnosis present

## 2021-03-23 DIAGNOSIS — O9912 Other diseases of the blood and blood-forming organs and certain disorders involving the immune mechanism complicating childbirth: Secondary | ICD-10-CM | POA: Diagnosis present

## 2021-03-23 DIAGNOSIS — O3663X0 Maternal care for excessive fetal growth, third trimester, not applicable or unspecified: Secondary | ICD-10-CM | POA: Diagnosis present

## 2021-03-23 DIAGNOSIS — O99214 Obesity complicating childbirth: Secondary | ICD-10-CM | POA: Diagnosis present

## 2021-03-23 DIAGNOSIS — Z3A39 39 weeks gestation of pregnancy: Secondary | ICD-10-CM

## 2021-03-23 DIAGNOSIS — Z20822 Contact with and (suspected) exposure to covid-19: Secondary | ICD-10-CM | POA: Diagnosis present

## 2021-03-23 DIAGNOSIS — D696 Thrombocytopenia, unspecified: Secondary | ICD-10-CM | POA: Diagnosis present

## 2021-03-23 DIAGNOSIS — D509 Iron deficiency anemia, unspecified: Secondary | ICD-10-CM | POA: Diagnosis present

## 2021-03-23 DIAGNOSIS — O9902 Anemia complicating childbirth: Secondary | ICD-10-CM | POA: Diagnosis present

## 2021-03-23 DIAGNOSIS — Z3490 Encounter for supervision of normal pregnancy, unspecified, unspecified trimester: Secondary | ICD-10-CM | POA: Diagnosis present

## 2021-03-23 DIAGNOSIS — O99354 Diseases of the nervous system complicating childbirth: Principal | ICD-10-CM | POA: Diagnosis present

## 2021-03-23 DIAGNOSIS — O26893 Other specified pregnancy related conditions, third trimester: Secondary | ICD-10-CM | POA: Diagnosis present

## 2021-03-23 DIAGNOSIS — O09893 Supervision of other high risk pregnancies, third trimester: Secondary | ICD-10-CM

## 2021-03-23 LAB — CBC
HCT: 37 % (ref 36.0–46.0)
Hemoglobin: 12.5 g/dL (ref 12.0–15.0)
MCH: 33.2 pg (ref 26.0–34.0)
MCHC: 33.8 g/dL (ref 30.0–36.0)
MCV: 98.4 fL (ref 80.0–100.0)
Platelets: 151 10*3/uL (ref 150–400)
RBC: 3.76 MIL/uL — ABNORMAL LOW (ref 3.87–5.11)
RDW: 16.1 % — ABNORMAL HIGH (ref 11.5–15.5)
WBC: 9.7 10*3/uL (ref 4.0–10.5)
nRBC: 0 % (ref 0.0–0.2)

## 2021-03-23 LAB — TYPE AND SCREEN
ABO/RH(D): O POS
Antibody Screen: NEGATIVE

## 2021-03-23 MED ORDER — SERTRALINE HCL 100 MG PO TABS
200.0000 mg | ORAL_TABLET | Freq: Every day | ORAL | Status: DC
  Administered 2021-03-23 – 2021-03-24 (×2): 200 mg via ORAL
  Filled 2021-03-23 (×2): qty 2

## 2021-03-23 MED ORDER — MISOPROSTOL 200 MCG PO TABS
800.0000 ug | ORAL_TABLET | Freq: Once | ORAL | Status: AC
  Administered 2021-03-23: 800 ug via ORAL

## 2021-03-23 MED ORDER — SODIUM CHLORIDE 0.9 % IV SOLN
5.0000 10*6.[IU] | Freq: Once | INTRAVENOUS | Status: AC
  Administered 2021-03-23: 5 10*6.[IU] via INTRAVENOUS
  Filled 2021-03-23: qty 5

## 2021-03-23 MED ORDER — OXYTOCIN-SODIUM CHLORIDE 30-0.9 UT/500ML-% IV SOLN
2.5000 [IU]/h | INTRAVENOUS | Status: DC
  Filled 2021-03-23: qty 500

## 2021-03-23 MED ORDER — AMMONIA AROMATIC IN INHA
RESPIRATORY_TRACT | Status: AC
  Filled 2021-03-23: qty 10

## 2021-03-23 MED ORDER — LACTATED RINGERS IV SOLN
500.0000 mL | INTRAVENOUS | Status: DC | PRN
  Administered 2021-03-23 (×2): 1000 mL via INTRAVENOUS

## 2021-03-23 MED ORDER — OXYTOCIN BOLUS FROM INFUSION
333.0000 mL | Freq: Once | INTRAVENOUS | Status: AC
  Administered 2021-03-23: 333 mL via INTRAVENOUS

## 2021-03-23 MED ORDER — DIPHENHYDRAMINE HCL 50 MG/ML IJ SOLN: 12.5000 mg | INTRAMUSCULAR | Status: DC | PRN

## 2021-03-23 MED ORDER — CEFAZOLIN SODIUM-DEXTROSE 2-4 GM/100ML-% IV SOLN
INTRAVENOUS | Status: AC
  Filled 2021-03-23: qty 100

## 2021-03-23 MED ORDER — OXYTOCIN-SODIUM CHLORIDE 30-0.9 UT/500ML-% IV SOLN
INTRAVENOUS | Status: AC
  Filled 2021-03-23: qty 500

## 2021-03-23 MED ORDER — MISOPROSTOL 25 MCG QUARTER TABLET
25.0000 ug | ORAL_TABLET | ORAL | Status: DC | PRN
  Administered 2021-03-23 (×2): 25 ug via VAGINAL
  Filled 2021-03-23 (×2): qty 1

## 2021-03-23 MED ORDER — LIDOCAINE-EPINEPHRINE (PF) 1.5 %-1:200000 IJ SOLN
INTRAMUSCULAR | Status: DC | PRN
  Administered 2021-03-23: 3 mL via EPIDURAL

## 2021-03-23 MED ORDER — CEFAZOLIN SODIUM-DEXTROSE 2-4 GM/100ML-% IV SOLN
2.0000 g | Freq: Once | INTRAVENOUS | Status: AC
  Administered 2021-03-23: 2 g via INTRAVENOUS

## 2021-03-23 MED ORDER — PENICILLIN G POT IN DEXTROSE 60000 UNIT/ML IV SOLN
3.0000 10*6.[IU] | INTRAVENOUS | Status: DC
  Filled 2021-03-23: qty 50

## 2021-03-23 MED ORDER — OXYCODONE-ACETAMINOPHEN 5-325 MG PO TABS: 2.0000 | ORAL_TABLET | ORAL | Status: DC | PRN

## 2021-03-23 MED ORDER — OXYCODONE-ACETAMINOPHEN 5-325 MG PO TABS: 1.0000 | ORAL_TABLET | ORAL | Status: DC | PRN

## 2021-03-23 MED ORDER — SERTRALINE HCL 100 MG PO TABS: 200.0000 mg | ORAL_TABLET | Freq: Every day | ORAL | Status: DC

## 2021-03-23 MED ORDER — FENTANYL-BUPIVACAINE-NACL 0.5-0.125-0.9 MG/250ML-% EP SOLN
EPIDURAL | Status: AC
  Filled 2021-03-23: qty 250

## 2021-03-23 MED ORDER — OXYTOCIN 10 UNIT/ML IJ SOLN
INTRAMUSCULAR | Status: AC
  Filled 2021-03-23: qty 2

## 2021-03-23 MED ORDER — OXYTOCIN-SODIUM CHLORIDE 30-0.9 UT/500ML-% IV SOLN
1.0000 m[IU]/min | INTRAVENOUS | Status: DC
Start: 2021-03-23 — End: 2021-03-24
  Administered 2021-03-23: 2 m[IU]/min via INTRAVENOUS

## 2021-03-23 MED ORDER — ONDANSETRON HCL 4 MG/2ML IJ SOLN: 4.0000 mg | Freq: Four times a day (QID) | INTRAMUSCULAR | Status: DC | PRN

## 2021-03-23 MED ORDER — EPHEDRINE 5 MG/ML INJ: 10.0000 mg | INTRAVENOUS | Status: DC | PRN

## 2021-03-23 MED ORDER — TERBUTALINE SULFATE 1 MG/ML IJ SOLN: 0.2500 mg | Freq: Once | INTRAMUSCULAR | Status: DC | PRN

## 2021-03-23 MED ORDER — LACTATED RINGERS IV SOLN
500.0000 mL | Freq: Once | INTRAVENOUS | Status: AC
  Administered 2021-03-23: 500 mL via INTRAVENOUS

## 2021-03-23 MED ORDER — BUPROPION HCL ER (XL) 150 MG PO TB24
150.0000 mg | ORAL_TABLET | Freq: Every day | ORAL | Status: DC
  Administered 2021-03-23 – 2021-03-24 (×2): 150 mg via ORAL
  Filled 2021-03-23 (×3): qty 1

## 2021-03-23 MED ORDER — PHENYLEPHRINE 40 MCG/ML (10ML) SYRINGE FOR IV PUSH (FOR BLOOD PRESSURE SUPPORT): 80.0000 ug | PREFILLED_SYRINGE | INTRAVENOUS | Status: DC | PRN

## 2021-03-23 MED ORDER — SOD CITRATE-CITRIC ACID 500-334 MG/5ML PO SOLN: 30.0000 mL | ORAL | Status: DC | PRN

## 2021-03-23 MED ORDER — LIDOCAINE HCL (PF) 1 % IJ SOLN
30.0000 mL | INTRAMUSCULAR | Status: DC | PRN
  Filled 2021-03-23: qty 30

## 2021-03-23 MED ORDER — METHYLERGONOVINE MALEATE 0.2 MG/ML IJ SOLN
INTRAMUSCULAR | Status: AC
  Administered 2021-03-23: 0.2 mg
  Filled 2021-03-23: qty 1

## 2021-03-23 MED ORDER — LACTATED RINGERS IV SOLN
INTRAVENOUS | Status: DC
  Administered 2021-03-23: 1000 mL via INTRAVENOUS

## 2021-03-23 MED ORDER — FENTANYL-BUPIVACAINE-NACL 0.5-0.125-0.9 MG/250ML-% EP SOLN
12.0000 mL/h | EPIDURAL | Status: DC | PRN
  Administered 2021-03-23: 12 mL/h via EPIDURAL

## 2021-03-23 MED ORDER — MISOPROSTOL 200 MCG PO TABS
ORAL_TABLET | ORAL | Status: AC
  Filled 2021-03-23: qty 4

## 2021-03-23 MED ORDER — ACETAMINOPHEN 325 MG PO TABS: 650.0000 mg | ORAL_TABLET | ORAL | Status: DC | PRN

## 2021-03-23 MED ORDER — LIDOCAINE HCL (PF) 1 % IJ SOLN
INTRAMUSCULAR | Status: DC | PRN
  Administered 2021-03-23 (×2): 5 mL via EPIDURAL

## 2021-03-23 NOTE — Progress Notes (Signed)
Labor Progress Note  Kelly David is a 30 y.o. G3P2002 at [redacted]w[redacted]d by ultrasound admitted for induction of labor due to multiple sclerosis and LGA.  Subjective: feeling more cramping   Objective: BP 129/78    Pulse 80    Temp 98.4 F (36.9 C) (Oral)    Resp 15    Ht 5\' 5"  (1.651 m)    Wt 96.6 kg    LMP 06/21/2020 (Exact Date)    BMI 35.45 kg/m  Notable VS details: reviewed   Fetal Assessment: FHT:  FHR: 130 bpm, variability: moderate,  accelerations:  Present,  decelerations:  Absent Category/reactivity:  Category I UC:   regular, every 2-4 minutes SVE:   3-4/50/-2 Membrane status: Intact Amniotic color: clear   Labs: Lab Results  Component Value Date   WBC 9.7 03/23/2021   HGB 12.5 03/23/2021   HCT 37.0 03/23/2021   MCV 98.4 03/23/2021   PLT 151 03/23/2021    Assessment / Plan: Induction of labor, progressing well -will start PCN abx prophylaxis -Start oxytocin for continued active management of induction of labor  Labor: Progressing normally Fetal Wellbeing:  Category I Pain Control:  Labor support without medications - may have epidural when desired  I/D:   GBS pos - will start IP prophylaxis, consider AROM after adequately treated  Anticipated MOD:  NSVD  03/25/2021, CNM 03/23/2021, 7:59 PM

## 2021-03-23 NOTE — H&P (Signed)
OB History & Physical   History of Present Illness:   Chief Complaint: Scheduled IOL   HPI:  Kelly David is a 30 y.o. G24P2002 female at [redacted]w[redacted]d dated by LMP of 06/21/2020, c/w [place] at [redacted]w[redacted]d.  She presents to L&D for scheduled IOL d/t multiple sclerosis and LGA  Reports active fetal movement  Contractions: denies  LOF/SROM: denies  Vaginal bleeding: denies   Factors complicating pregnancy:  Multiple Sclerosis- followed by [facility] Hx depression Obesity, BMI 35.3 Maternal iron deficiency anemia  Uterine S>D  Patient Active Problem List   Diagnosis Date Noted   Uterine size-date discrepancy in third trimester 03/23/2021   Encounter for induction of labor 03/23/2021   Supervision of other high risk pregnancies, third trimester 08/09/2020   Upper extremity weakness 04/17/2020   Hypokalemia 04/17/2020   Drug-induced hyperglycemia 04/17/2020   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 Authorizing Provider  cholecalciferol (VITAMIN D3) 25 MCG (1000 UNIT) tablet Take 1,000 Units by mouth daily.    [provider]  ferrous sulfate 325 (65 FE) MG EC tablet Take 325 mg by mouth daily.    [provider]  Glatiramer Acetate (COPAXONE) 40 MG/ML SOSY Inject 40 mg into the skin 3 (three) times a week.    [provider]  pantoprazole (PROTONIX) 40 MG tablet Take 1 tablet (40 mg total) by mouth daily. 04/19/20   Arnetha Courser, MD  prenatal vitamin w/FE, FA (PRENATAL 1 + 1) 27-1 MG  TABS tablet Take 1 tablet by mouth daily.    [provider]  sertraline (ZOLOFT) 100 MG tablet Take 200 mg by mouth daily.    [provider]  vitamin B-12 (CYANOCOBALAMIN) 1000 MCG tablet Take 1,000 mcg by mouth daily.    [provider]     Prenatal care site:  [facility]  Social History: She  reports that she has never smoked. She has never used smokeless tobacco. She reports that she does not drink alcohol and does not use drugs.  Family History: family history includes Multiple sclerosis in her father.   Review of Systems: A full review of systems was performed and negative except as noted in the HPI.     Physical Exam:  Vital Signs: BP 107/86 (BP Location: Left Arm)    Pulse 97    Resp 16    LMP 06/21/2020 (Exact Date)  Physical Exam  General: no acute distress.  HEENT: normocephalic, atraumatic Heart: regular rate & rhythm.  No murmurs/rubs/gallops Lungs: clear to auscultation bilaterally, normal respiratory effort Abdomen: soft, gravid, non-tender;  EFW: 9lbs Pelvic:   External: Normal external female genitalia  Cervix: Dilation: 1 / Effacement (%): 50 / Station: -3    Extremities: non-tender, symmetric, No edema bilaterally.  DTRs: 2+/2+  Neurologic: Alert & oriented x 3.    Results for orders placed or performed during the hospital encounter of 03/23/21 (from the past 24 hour(s))  CBC     Status:  Abnormal   Collection Time: 03/23/21  9:03 AM  Result Value Ref Range   WBC 9.7 4.0 - 10.5 K/uL   RBC 3.76 (L) 3.87 - 5.11 MIL/uL   Hemoglobin 12.5 12.0 - 15.0 g/dL   HCT 19.4 17.4 - 08.1 %   MCV 98.4 80.0 - 100.0 fL   MCH 33.2 26.0 - 34.0 pg   MCHC 33.8 30.0 - 36.0 g/dL   RDW 44.8 (H) 18.5 - 63.1 %   Platelets 151 150 - 400 K/uL   nRBC 0.0 0.0 - 0.2 %  Type and screen     Status: None (Preliminary result)   Collection Time: 03/23/21  9:03 AM  Result Value Ref Range   ABO/RH(D) PENDING    Antibody Screen PENDING    Sample  Expiration      03/26/2021,2359 Performed at [facility], [address] [place], [place] [address]     Pertinent Results:  Prenatal Labs: Blood type/Rh O pos  Antibody screen neg  Rubella Immune  Varicella Immune  RPR NR  HBsAg Neg  HIV NR  GC neg  Chlamydia neg  Genetic screening Declined   1 hour GTT 131  3 hour GTT N/A  GBS Pos   FHT:  FHR: 140 bpm, variability: moderate,  accelerations:  Present,  decelerations:  Absent Category/reactivity:  Category I UC:   irregular, mild contractions    Cephalic by Leopolds and SVE   No results found.  Assessment:  Kelly David is a 30 y.o. G52P2002 female at [redacted]w[redacted]d with scheduled IOL with multiple sclerosis and LGA infant.   Plan:  1. Admit to Labor & Delivery; consents reviewed and obtained - Covid admission screen   2. Fetal Well being  - Fetal Tracing: cat 1 - Group B Streptococcus ppx  indicated: GBS pos - Presentation: cephalic confirmed by SVE   3. Routine OB: - Prenatal labs reviewed, as above - Rh pos - CBC, T&S, RPR on admit - Reg diet, IVF  4. Induction of labor  - Contractions monitored with external toco - Pelvis proven to 3890g, adequate for trial of labor  - Plan for induction with misoprostol  - Induction with oxytocin, AROM, and cervical balloon as appropriate  - Plan for  continuous fetal monitoring - Maternal pain control as desired - Anticipate vaginal delivery  5. Post Partum Planning: - Infant feeding: TBD - Contraception: TBD - Tdap vaccine: given 01/09/2021 - Flu vaccine: declined   Gustavo Lah, CNM 03/23/21 10:03 AM  Margaretmary Eddy, CNM Certified Nurse Midwife [facility]

## 2021-03-23 NOTE — Anesthesia Preprocedure Evaluation (Addendum)
Anesthesia Evaluation  Patient identified by MRN, date of birth, ID band Patient awake    Reviewed: Allergy & Precautions, NPO status , Patient's Chart, lab work & pertinent test results  History of Anesthesia Complications Negative for: history of anesthetic complications  Airway Mallampati: I  TM Distance: >3 FB Neck ROM: Full    Dental no notable dental hx.    Pulmonary neg pulmonary ROS,    Pulmonary exam normal breath sounds clear to auscultation       Cardiovascular Exercise Tolerance: Good METS: 3 - Mets negative cardio ROS Normal cardiovascular exam Rhythm:Regular Rate:Normal     Neuro/Psych PSYCHIATRIC DISORDERS Depression  Neuromuscular disease (Multiple Sclerosis)    GI/Hepatic negative GI ROS, Neg liver ROS,   Endo/Other  negative endocrine ROS  Renal/GU negative Renal ROS  negative genitourinary   Musculoskeletal negative musculoskeletal ROS (+)   Abdominal   Peds  Hematology negative hematology ROS (+) anemia ,   Anesthesia Other Findings   Reproductive/Obstetrics negative OB ROS                            Anesthesia Physical Anesthesia Plan  ASA: 2  Anesthesia Plan: Epidural   Post-op Pain Management:    Induction:   PONV Risk Score and Plan:   Airway Management Planned: Natural Airway  Additional Equipment:   Intra-op Plan:   Post-operative Plan:   Informed Consent: I have reviewed the patients History and Physical, chart, labs and discussed the procedure including the risks, benefits and alternatives for the proposed anesthesia with the patient or authorized representative who has indicated his/her understanding and acceptance.     Dental Advisory Given  Plan Discussed with:   Anesthesia Plan Comments: (Patient reports no bleeding problems and no anticoagulant use.   Patient consented for risks of anesthesia including but not limited to:  -  adverse reactions to medications - risk of bleeding, infection and or nerve damage from epidural that could lead to paralysis - risk of headache or failed epidural - nerve damage due to positioning - that if epidural is used for C-section that there is a chance of epidural failure requiring spinal placement or conversion to GA - Damage to heart, brain, lungs, other parts of body or loss of life  Patient voiced understanding.)       Anesthesia Quick Evaluation

## 2021-03-23 NOTE — Anesthesia Procedure Notes (Signed)
Epidural Patient location during procedure: OB Start time: 03/23/2021 8:49 PM End time: 03/23/2021 9:13 PM  Staffing Anesthesiologist: Felicita Gage, MD Performed: anesthesiologist   Preanesthetic Checklist Completed: patient identified, IV checked, site marked, risks and benefits discussed, surgical consent, monitors and equipment checked, pre-op evaluation and timeout performed  Epidural Patient position: sitting Prep: ChloraPrep Patient monitoring: heart rate, continuous pulse ox and blood pressure Approach: midline Location: L3-L4 Injection technique: LOR saline  Needle:  Needle type: Tuohy  Needle gauge: 17 G Needle length: 9 cm and 9 Needle insertion depth: 6.5 cm Catheter type: closed end flexible Catheter size: 19 Gauge Catheter at skin depth: 13 cm Test dose: negative and 1.5% lidocaine with Epi 1:200 K  Assessment Sensory level: T10 Events: blood not aspirated, injection not painful, no injection resistance, no paresthesia and negative IV test  Additional Notes 1 attempt Pt. Evaluated and documentation done after procedure finished. Patient identified. Risks/Benefits/Options discussed with patient including but not limited to bleeding, infection, nerve damage, paralysis, failed block, incomplete pain control, headache, blood pressure changes, nausea, vomiting, reactions to medication both or allergic, itching and postpartum back pain. Confirmed with bedside nurse the patient's most recent platelet count. Confirmed with patient that they are not currently taking any anticoagulation, have any bleeding history or any family history of bleeding disorders. Patient expressed understanding and wished to proceed. All questions were answered. Sterile technique was used throughout the entire procedure. Please see nursing notes for vital signs. Test dose was given through epidural catheter and negative prior to continuing to dose epidural or start infusion. Warning signs of high  block given to the patient including shortness of breath, tingling/numbness in hands, complete motor block, or any concerning symptoms with instructions to call for help. Patient was given instructions on fall risk and not to get out of bed. All questions and concerns addressed with instructions to call with any issues or inadequate analgesia.    Patient tolerated the insertion well without immediate complications.Reason for block:at surgeon's request and procedure for pain

## 2021-03-24 ENCOUNTER — Encounter: Payer: Self-pay | Admitting: Obstetrics and Gynecology

## 2021-03-24 LAB — RPR: RPR Ser Ql: NONREACTIVE

## 2021-03-24 LAB — CBC
HCT: 34.2 % — ABNORMAL LOW (ref 36.0–46.0)
Hemoglobin: 11.5 g/dL — ABNORMAL LOW (ref 12.0–15.0)
MCH: 32.4 pg (ref 26.0–34.0)
MCHC: 33.6 g/dL (ref 30.0–36.0)
MCV: 96.3 fL (ref 80.0–100.0)
Platelets: 146 10*3/uL — ABNORMAL LOW (ref 150–400)
RBC: 3.55 MIL/uL — ABNORMAL LOW (ref 3.87–5.11)
RDW: 16.1 % — ABNORMAL HIGH (ref 11.5–15.5)
WBC: 17.4 10*3/uL — ABNORMAL HIGH (ref 4.0–10.5)
nRBC: 0 % (ref 0.0–0.2)

## 2021-03-24 MED ORDER — BENZOCAINE-MENTHOL 20-0.5 % EX AERO
1.0000 "application " | INHALATION_SPRAY | CUTANEOUS | Status: DC | PRN
  Administered 2021-03-24: 1 via TOPICAL
  Filled 2021-03-24: qty 56

## 2021-03-24 MED ORDER — FERROUS SULFATE 325 (65 FE) MG PO TABS
325.0000 mg | ORAL_TABLET | Freq: Two times a day (BID) | ORAL | Status: DC
  Administered 2021-03-24 – 2021-03-25 (×3): 325 mg via ORAL
  Filled 2021-03-24 (×3): qty 1

## 2021-03-24 MED ORDER — PRENATAL MULTIVITAMIN CH
1.0000 | ORAL_TABLET | Freq: Every day | ORAL | Status: DC
  Administered 2021-03-24 – 2021-03-25 (×2): 1 via ORAL
  Filled 2021-03-24 (×2): qty 1

## 2021-03-24 MED ORDER — OXYCODONE HCL 5 MG PO TABS: 10.0000 mg | ORAL_TABLET | ORAL | Status: DC | PRN

## 2021-03-24 MED ORDER — ACETAMINOPHEN 500 MG PO TABS
1000.0000 mg | ORAL_TABLET | Freq: Four times a day (QID) | ORAL | Status: DC | PRN
  Administered 2021-03-24: 03:00:00 1000 mg via ORAL
  Filled 2021-03-24: qty 2

## 2021-03-24 MED ORDER — DIPHENHYDRAMINE HCL 25 MG PO CAPS
25.0000 mg | ORAL_CAPSULE | Freq: Four times a day (QID) | ORAL | Status: DC | PRN
  Administered 2021-03-24: 03:00:00 25 mg via ORAL

## 2021-03-24 MED ORDER — COCONUT OIL OIL
1.0000 "application " | TOPICAL_OIL | Status: DC | PRN
  Filled 2021-03-24: qty 120

## 2021-03-24 MED ORDER — DIPHENHYDRAMINE HCL 25 MG PO CAPS
ORAL_CAPSULE | ORAL | Status: AC
  Filled 2021-03-24: qty 1

## 2021-03-24 MED ORDER — ONDANSETRON HCL 4 MG/2ML IJ SOLN: 4.0000 mg | INTRAMUSCULAR | Status: DC | PRN

## 2021-03-24 MED ORDER — WITCH HAZEL-GLYCERIN EX PADS
1.0000 "application " | MEDICATED_PAD | CUTANEOUS | Status: DC
  Filled 2021-03-24: qty 100

## 2021-03-24 MED ORDER — ONDANSETRON HCL 4 MG PO TABS
4.0000 mg | ORAL_TABLET | ORAL | Status: DC | PRN
  Filled 2021-03-24: qty 1

## 2021-03-24 MED ORDER — IBUPROFEN 600 MG PO TABS
600.0000 mg | ORAL_TABLET | Freq: Four times a day (QID) | ORAL | Status: DC
  Administered 2021-03-24 – 2021-03-25 (×7): 600 mg via ORAL
  Filled 2021-03-24 (×7): qty 1

## 2021-03-24 MED ORDER — DIBUCAINE (PERIANAL) 1 % EX OINT
1.0000 "application " | TOPICAL_OINTMENT | CUTANEOUS | Status: DC | PRN
  Filled 2021-03-24: qty 28

## 2021-03-24 MED ORDER — SIMETHICONE 80 MG PO CHEW: 80.0000 mg | CHEWABLE_TABLET | ORAL | Status: DC | PRN

## 2021-03-24 MED ORDER — OXYCODONE HCL 5 MG PO TABS: 5.0000 mg | ORAL_TABLET | ORAL | Status: DC | PRN

## 2021-03-24 MED ORDER — DOCUSATE SODIUM 100 MG PO CAPS
100.0000 mg | ORAL_CAPSULE | Freq: Two times a day (BID) | ORAL | Status: DC
  Administered 2021-03-24 – 2021-03-25 (×3): 100 mg via ORAL
  Filled 2021-03-24 (×3): qty 1

## 2021-03-24 NOTE — Discharge Instructions (Addendum)
Vaginal Delivery, Care After Refer to this sheet in the next few weeks. These discharge instructions provide you with information on caring for yourself after delivery. Your caregiver may also give you specific instructions. Your treatment has been planned according to the most current medical practices available, but problems sometimes occur. Call your caregiver if you have any problems or questions after you go home. HOME CARE INSTRUCTIONS Take over-the-counter or prescription medicines only as directed by your caregiver or pharmacist. Do not drink alcohol, especially if you are breastfeeding or taking medicine to relieve pain. Do not smoke tobacco. Continue to use good perineal care. Good perineal care includes: Wiping your perineum from back to front Keeping your perineum clean. You can do sitz baths twice a day, to help keep this area clean Do not use tampons, douche or have sex until your caregiver says it is okay. Shower only and avoid sitting in submerged water, aside from sitz baths Wear a well-fitting bra that provides breast support. Eat healthy foods. Drink enough fluids to keep your urine clear or pale yellow. Eat high-fiber foods such as whole grain cereals and breads, brown rice, beans, and fresh fruits and vegetables every day. These foods may help prevent or relieve constipation. Avoid constipation with high fiber foods or medications, such as miralax or metamucil Follow your caregiver's recommendations regarding resumption of activities such as climbing stairs, driving, lifting, exercising, or traveling. Talk to your caregiver about resuming sexual activities. Resumption of sexual activities is dependent upon your risk of infection, your rate of healing, and your comfort and desire to resume sexual activity. Try to have someone help you with your household activities and your newborn for at least a few days after you leave the hospital. Rest as much as possible. Try to rest or  take a nap when your newborn is sleeping. Increase your activities gradually. Keep all of your scheduled postpartum appointments. It is very important to keep your scheduled follow-up appointments. At these appointments, your caregiver will be checking to make sure that you are healing physically and emotionally. SEEK MEDICAL CARE IF:  You are passing large clots from your vagina. Save any clots to show your caregiver. You have a foul smelling discharge from your vagina. You have trouble urinating. You are urinating frequently. You have pain when you urinate. You have a change in your bowel movements. You have increasing redness, pain, or swelling near your vaginal incision (episiotomy) or vaginal tear. You have pus draining from your episiotomy or vaginal tear. Your episiotomy or vaginal tear is separating. You have painful, hard, or reddened breasts. You have a severe headache. You have blurred vision or see spots. You feel sad or depressed. You have thoughts of hurting yourself or your newborn. You have questions about your care, the care of your newborn, or medicines. You are dizzy or light-headed. You have a rash. You have nausea or vomiting. You were breastfeeding and have not had a menstrual period within 12 weeks after you stopped breastfeeding. You are not breastfeeding and have not had a menstrual period by the 12th week after delivery. You have a fever. SEEK IMMEDIATE MEDICAL CARE IF:  You have persistent pain. You have chest pain. You have shortness of breath. You faint. You have leg pain. You have stomach pain. Your vaginal bleeding saturates two or more sanitary pads in 1 hour. MAKE SURE YOU:  Understand these instructions. Will watch your condition. Will get help right away if you are not doing well or   get worse. Document Released: 03/21/2000 Document Revised: 08/08/2013 Document Reviewed: 11/19/2011 ExitCare Patient Information 2015 [employer]. This  information is not intended to replace advice given to you by your health care provider. Make sure you discuss any questions you have with your health care provider.  Sitz Bath A sitz bath is a warm water bath taken in the sitting position. The water covers only the hips and butt (buttocks). We recommend using one that fits in the toilet, to help with ease of use and cleanliness. It may be used for either healing or cleaning purposes. Sitz baths are also used to relieve pain, itching, or muscle tightening (spasms). The water may contain medicine. Moist heat will help you heal and relax.  HOME CARE  Take 3 to 4 sitz baths a day. Fill the bathtub half-full with warm water. Sit in the water and open the drain a little. Turn on the warm water to keep the tub half-full. Keep the water running constantly. Soak in the water for 15 to 20 minutes. After the sitz bath, pat the affected area dry. GET HELP RIGHT AWAY IF: You get worse instead of better. Stop the sitz baths if you get worse. MAKE SURE YOU: Understand these instructions. Will watch your condition. Will get help right away if you are not doing well or get worse. Document Released: 05/01/2004 Document Revised: 12/17/2011 Document Reviewed: 07/22/2010 ExitCare Patient Information 2015 [employer]. This information is not intended to replace advice given to you by your health care provider. Make sure you discuss any questions you have with your health care provider.   

## 2021-03-24 NOTE — Discharge Summary (Signed)
Obstetrical Discharge Summary  Patient Name: Kelly David DOB: 14-Mar-1991 MRN: [id number]  Date of Admission: 03/23/2021 Date of Delivery: 03/23/2021 Delivered by: Margaretmary Eddy, CNM  Date of Discharge: 03/25/2021  Primary OB: [facility] VOZ:[id number] last menstrual period was 06/21/2020 (exact date). EDC Estimated Date of Delivery: 03/28/21 Gestational Age at Delivery: [redacted]w[redacted]d   Antepartum complications:  Multiple Sclerosis- followed by 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  2 doses of misoprostol was used for cervical ripening.  1st dose of PCN was given when she reported mild cramping and cervix was 3-4/50/-2.  Oxytocin was initially started at 2 milliunits/min but then turned off an hour later when membranes spontaneously ruptured to allow time for 2nd dose of PCN.  She received an epidural and progressed quickly to C/C/+2 with an urge to push.  She pushed well over 2 minutes for a spontaneous vaginal birth.   Brisk bleeding noted prior to placenta with a large gush with clots as placenta delivered.  Fundal massage performed, expressed several large clots with large gush of blood.  Corrinna reported feeling dizzy and "like I'm going to pass out".  Additional personnel requested to room.  Given Methergine  IM.  LR IVF bolus started. Foley catheter inserted.  Uterine sweep performed and several moderate to large clots expressed.  Misoprostol given buccally.  HeatherFundus became firm and bleeding decreased to small with fundal massages.   Delivery Type: spontaneous vaginal delivery Anesthesia: epidural Placenta: spontaneous Laceration: 1st degree vaginal -hemostatic, approximates well, no repair  Episiotomy: none Newborn Data: Live born female "Dahlia Client" Birth Weight:  9lbs 1oz APGAR: 8, 8  Newborn Delivery   Birth date/time: 03/23/2021 22:53:00 Delivery type: Vaginal, Spontaneous     Postpartum Procedures: None Edinburgh:  Edinburgh Postnatal Depression Scale Screening Tool 03/24/2021 08/11/2018  I have been able to laugh and see the funny side of things. 0 0  I have looked forward with enjoyment to things. 0 0  I have blamed myself unnecessarily when things went wrong. 1 1  I have been anxious or worried for no good reason. 1 0  I have felt scared or panicky for no good reason. 1 1  Things have been getting on top of me. 1 1  I have been so unhappy that I have had difficulty sleeping. 0 0  I have felt sad or miserable. 1 1  I have been so unhappy that I have been crying. 0 0  The thought of harming myself has occurred to me. 0 0  Edinburgh Postnatal Depression Scale Total 5 4     Post partum course:  Patient had an uncomplicated postpartum course.  By time of discharge on PPD#2, her pain was controlled on oral pain medications; she had appropriate lochia and was ambulating, voiding without difficulty and tolerating regular diet.  She was deemed stable for discharge to home.    Discharge Physical Exam:  BP 117/77 (BP Location:  Right Arm)    Pulse 79    Temp 97.7 F (36.5 C) (Oral)    Resp 20    Ht 5\' 5"  (1.651 m)    Wt 96.6 kg    LMP 06/21/2020 (Exact Date)    SpO2 95%    Breastfeeding Unknown    BMI 35.45 kg/m   General: NAD CV: RRR Pulm: CTABL, nl effort ABD: s/nd/nt, fundus  firm and below the umbilicus Lochia: moderate Perineum: minimal edema/repair well approximated DVT Evaluation: LE non-ttp, no evidence of DVT on exam.  Hemoglobin  Date Value Ref Range Status  03/25/2021 10.4 (L) 12.0 - 15.0 g/dL Final   HCT  Date Value Ref Range Status  03/25/2021 31.3 (L) 36.0 - 46.0 % Final     Disposition: stable, discharge to home. Baby Feeding: formula Baby Disposition: home with mom  Rh Immune globulin given: Rh pos  Rubella vaccine given: Immune Varivax vaccine given: Immune Flu vaccine given in AP or PP setting: declined  Tdap vaccine given in AP or PP setting: given 01/09/2021  Contraception: TBD  Prenatal Labs:  Blood type/Rh O pos  Antibody screen neg  Rubella Immune  Varicella Immune  RPR NR  HBsAg Neg  HIV NR  GC neg  Chlamydia neg  Genetic screening Declined   1 hour GTT 131  3 hour GTT N/A  GBS Pos    Plan:  Scott Fix was discharged to home in good condition. Follow-up appointment with [facility] in 2 weeks for postpartum mood check and with delivering provider in 6 weeks.  Discharge Medications: Allergies as of 03/25/2021   No Known Allergies      Medication List     STOP taking these medications    aspirin EC 81 MG tablet   ferrous sulfate 325 (65 FE) MG EC tablet   folic acid 1 MG tablet Commonly known as: FOLVITE   Glatiramer Acetate 40 MG/ML Sosy   vitamin B-12 1000 MCG tablet Commonly known as: CYANOCOBALAMIN       TAKE these medications    buPROPion 150 MG 24 hr tablet Commonly known as: WELLBUTRIN XL Take 150 mg by mouth daily.   pantoprazole 40 MG tablet Commonly known as: PROTONIX Take 1 tablet (40 mg total) by mouth daily.   prenatal vitamin w/FE, FA 27-1 MG Tabs tablet Take 1 tablet by mouth daily.   sertraline 100 MG tablet Commonly known as: ZOLOFT Take 200 mg by mouth daily.         Follow-up Information     [facility]. Schedule an appointment as soon as  possible for a visit in 2 week(s).   Why: postpartum mood check Contact information: [address] [facility] [phone]        [phone], CNM. Schedule an appointment as soon as possible for a visit in 6 week(s).   Specialty: Certified Nurse Midwife Why: postpartum visit Contact information: [address] [place] [phone]                 Signed: [phone]  03/25/2021 8:33 AM

## 2021-03-24 NOTE — Progress Notes (Signed)
Postpartum Day  1  Subjective: no complaints and tolerating PO, resting in bed  Doing well, no concerns. Pain managed with PO meds, tolerating regular diet.  Foley cath remains in situ, draining clear, yellow urine  No fever/chills, chest pain, shortness of breath, nausea/vomiting, or leg pain. No nipple or breast pain. No headache, visual changes, or RUQ/epigastric pain.  Objective: BP 137/88 (BP Location: Left Arm)    Pulse 74    Temp 98.2 F (36.8 C) (Oral)    Resp 18    Ht 5\' 5"  (1.651 m)    Wt 96.6 kg    LMP 06/21/2020 (Exact Date)    SpO2 96%    Breastfeeding Unknown    BMI 35.45 kg/m    Physical Exam:  General: alert, cooperative, no distress, and pale Breasts: soft/nontender CV: RRR Pulm: nl effort, CTABL Abdomen: soft, non-tender, active bowel sounds Uterine Fundus: firm Perineum: minimal edema, laceration hemostatic Lochia: appropriate DVT Evaluation: No evidence of DVT seen on physical exam.  Recent Labs    03/23/21 0903 03/24/21 0615  HGB 12.5 11.5*  HCT 37.0 34.2*  WBC 9.7 17.4*  PLT 151 146*    Assessment/Plan: 30 y.o. G3P3003 postpartum day # 1  -Continue routine postpartum care -Encouraged snug fitting bra, cold application, Tylenol PRN, and cabbage leaves for engorgement for formula feeding  -S/P PPH - CBC reviewed - Mild thrombocytopenia, hemodynamically stable and asymptomatic; start PO ferrous sulfate BID with stool softeners  -Repeat CBC in AM -Remove foley cath this morning -Immunization status:   all immunizations up to date -Will ambulate with assistance today and advance activity as tolerated  -Plan shower after she is able to ambulate well without symptoms    Disposition: Continue inpatient postpartum care  LOS: 1 day   34, CNM 03/24/2021, 9:20 AM   ----- 03/26/2021  Certified Nurse Midwife [facility]

## 2021-03-25 LAB — CBC
HCT: 31.3 % — ABNORMAL LOW (ref 36.0–46.0)
Hemoglobin: 10.4 g/dL — ABNORMAL LOW (ref 12.0–15.0)
MCH: 32.3 pg (ref 26.0–34.0)
MCHC: 33.2 g/dL (ref 30.0–36.0)
MCV: 97.2 fL (ref 80.0–100.0)
Platelets: 135 10*3/uL — ABNORMAL LOW (ref 150–400)
RBC: 3.22 MIL/uL — ABNORMAL LOW (ref 3.87–5.11)
RDW: 16 % — ABNORMAL HIGH (ref 11.5–15.5)
WBC: 10.1 10*3/uL (ref 4.0–10.5)
nRBC: 0 % (ref 0.0–0.2)

## 2021-03-25 NOTE — Anesthesia Postprocedure Evaluation (Signed)
Anesthesia Post Note  Patient: Kelly David  Procedure(s) Performed: AN AD HOC LABOR EPIDURAL  Patient location during evaluation: Mother Baby Anesthesia Type: Epidural Level of consciousness: awake and alert Pain management: pain level controlled Vital Signs Assessment: post-procedure vital signs reviewed and stable Respiratory status: spontaneous breathing, nonlabored ventilation and respiratory function stable Cardiovascular status: stable Postop Assessment: no headache, no backache and epidural receding Anesthetic complications: no   No notable events documented.   Last Vitals:  Vitals:   03/24/21 1625 03/24/21 2317  BP: 121/82 117/69  Pulse: 78 65  Resp: 18 18  Temp: (!) 36.4 C 36.6 C  SpO2: 98% 96%    Last Pain:  Vitals:   03/25/21 0335  TempSrc:   PainSc: Asleep                 Jeanine Luz

## 2021-03-25 NOTE — Progress Notes (Signed)
Mother discharged.  Discharge instructions given.  Mother verbalizes understanding.  Transported by auxiliary.  

## 2021-04-26 ENCOUNTER — Other Ambulatory Visit: Payer: Self-pay | Admitting: Neurology

## 2021-04-26 DIAGNOSIS — G35 Multiple sclerosis: Secondary | ICD-10-CM

## 2021-05-14 ENCOUNTER — Ambulatory Visit
Admission: RE | Admit: 2021-05-14 | Discharge: 2021-05-14 | Disposition: A | Discharge status: Home / Self Care | Payer: BC Managed Care – PPO | Source: Ambulatory Visit | Attending: Neurology | Admitting: Neurology

## 2021-05-14 DIAGNOSIS — G35 Multiple sclerosis: Secondary | ICD-10-CM

## 2021-05-14 IMAGING — MR MR CERVICAL SPINE WO/W CM
8 series · 41 of 48 positions shown · IV contrast (with contrast)
Comparison: Prior MRI from [DATE].

CLINICAL DATA: Follow-up examination for multiple sclerosis, recent
neuropathic pain in bilateral feet.

EXAM:
MRI CERVICAL SPINE WITHOUT AND WITH CONTRAST
TECHNIQUE: Multiplanar and multiecho pulse sequences of the cervical spine, to
include the craniocervical junction and cervicothoracic junction,
were obtained without and with intravenous contrast.
CONTRAST:  20mL MULTIHANCE GADOBENATE DIMEGLUMINE 529 MG/ML IV SOLN

[Series 18: T1 · sagittal · 3.0mm · 0.86mm/px · 3 of 13 slices shown (1 of 2)]
[im 1/13]
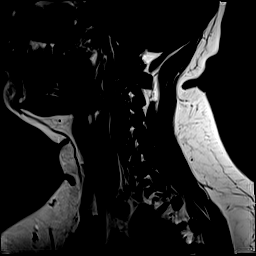
[im 7/13]
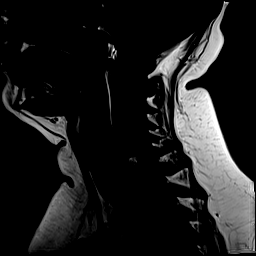
[im 13/13]
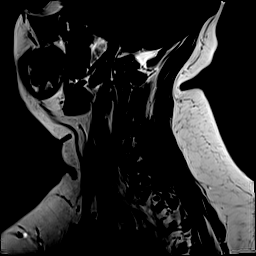

[Series 19: STIR · sagittal · 3.0mm · 0.34mm/px · 4 of 15 slices shown]
[im 1/15]
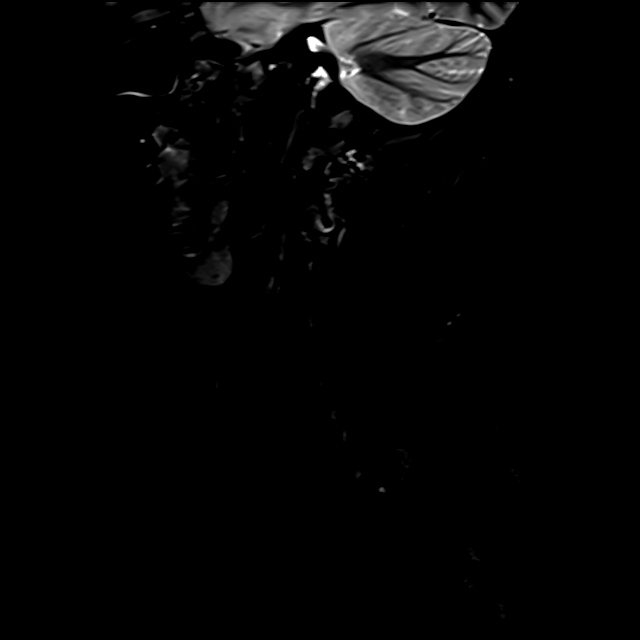
[im 5/15]
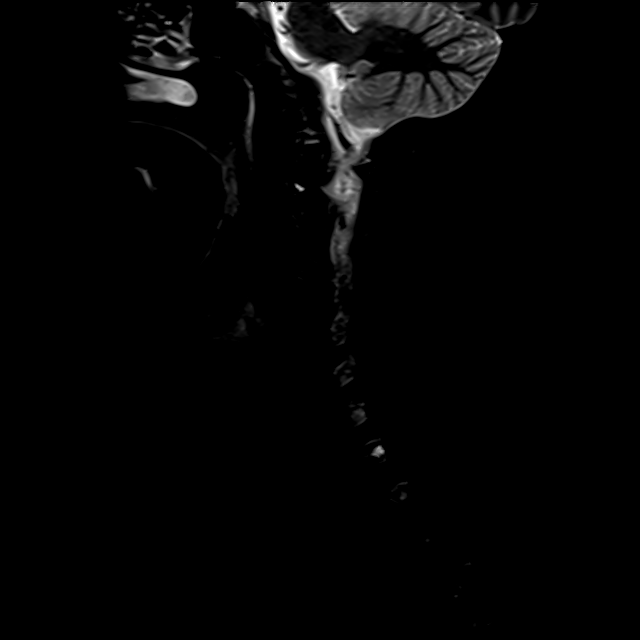
[im 10/15]
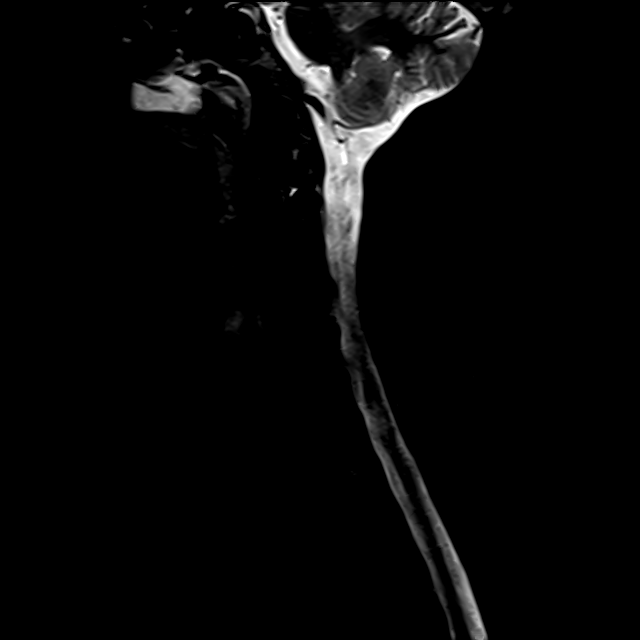
[im 15/15]
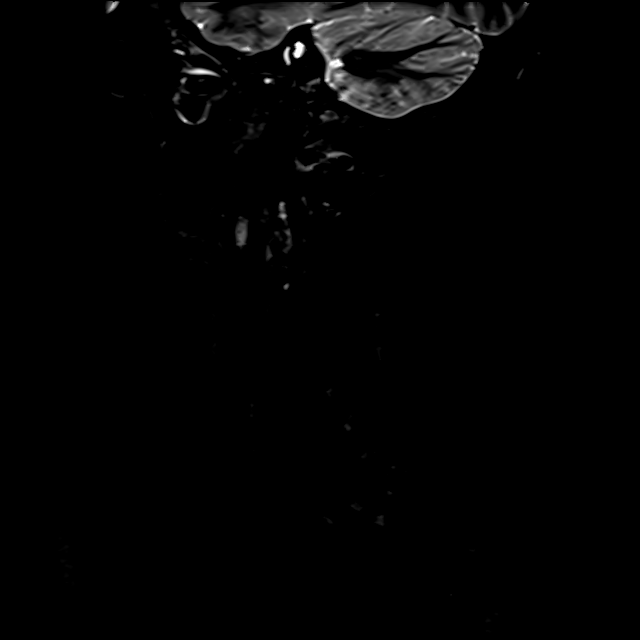

[Series 20: T2 · axial · 3.0mm · 0.70mm/px · z∈[-190,-88]mm · 8 of 33 slices shown]
[im 1/33]
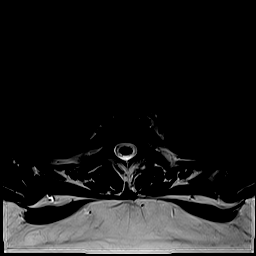
[im 5/33]
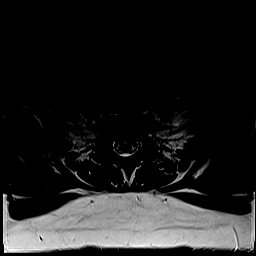
[im 10/33]
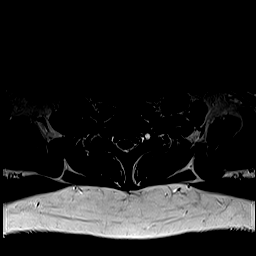
[im 14/33]
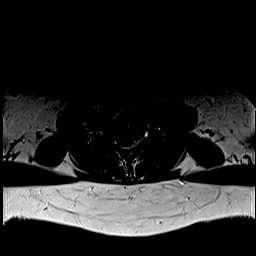
[im 19/33]
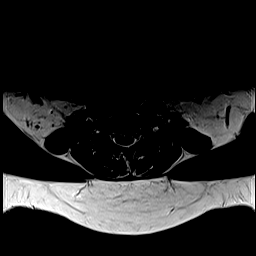
[im 23/33]
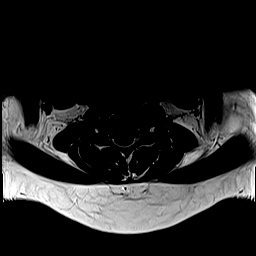
[im 28/33]
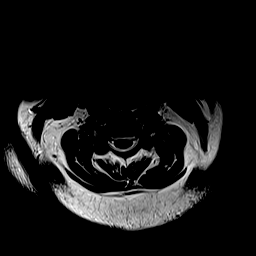
[im 33/33]
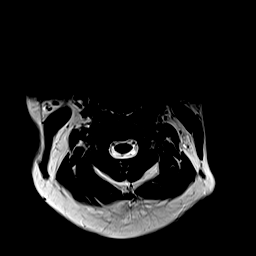

[Series 21: GRE · axial · 3.0mm · 0.35mm/px · z∈[-192,-164]mm · 3 of 32 slices shown]
[im 1/32]
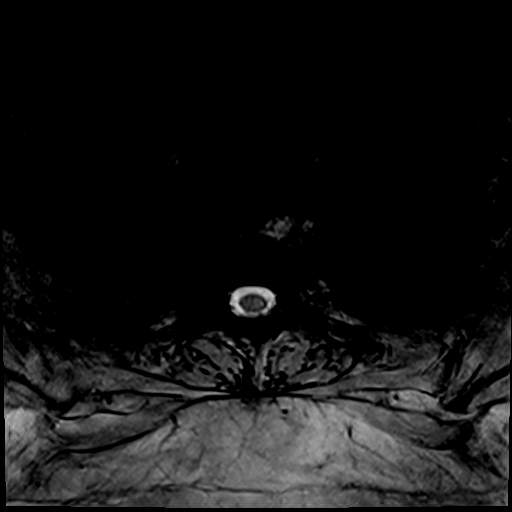
[im 5/32]
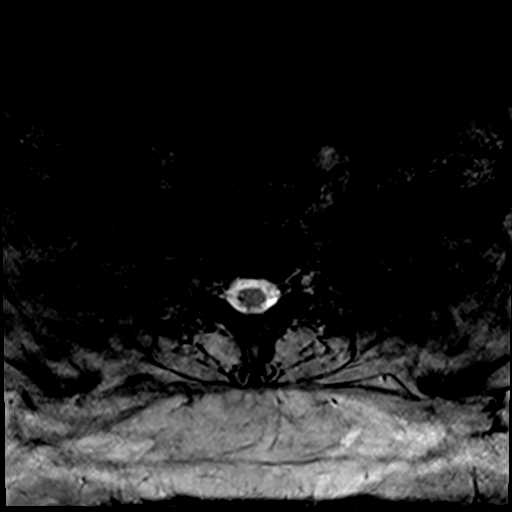
[im 9/32]
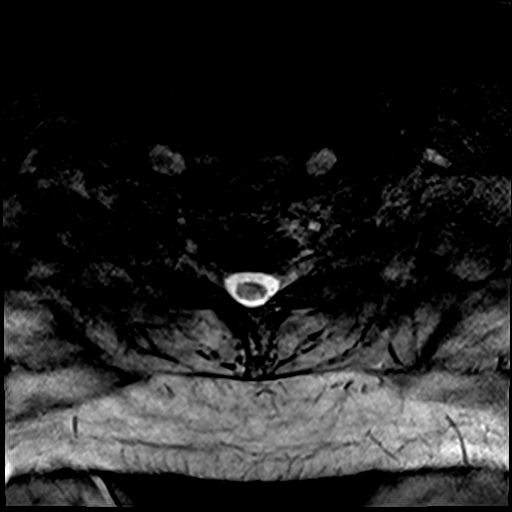

[Series 22: T1 · axial · 3.0mm · 0.72mm/px · z∈[-197,-83]mm · 9 of 35 slices shown (2 of 2)]
[im 1/35]
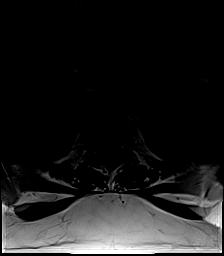
[im 5/35]
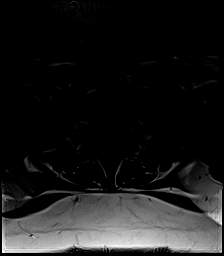
[im 9/35]
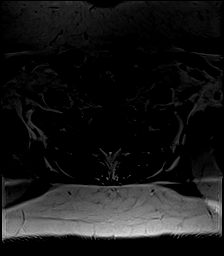
[im 13/35]
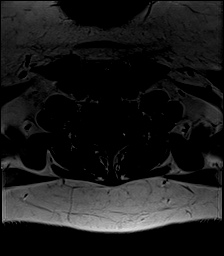
[im 18/35]
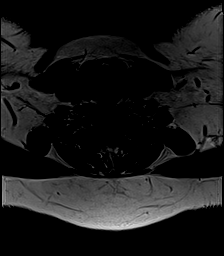
[im 22/35]
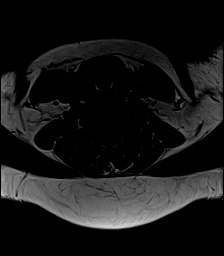
[im 26/35]
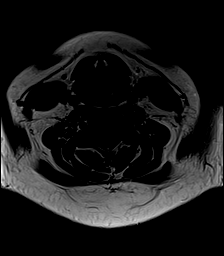
[im 30/35]
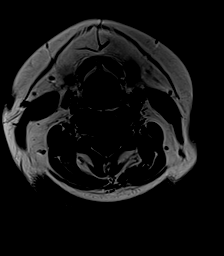
[im 35/35]
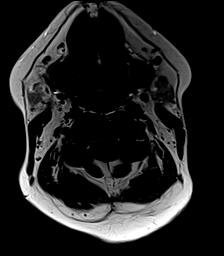

[Series 23: T2 post-contrast · sagittal · 3.0mm · 0.86mm/px · 3 of 12 slices shown]
[im 1/12]
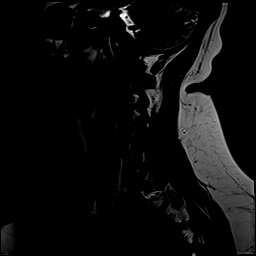
[im 6/12]
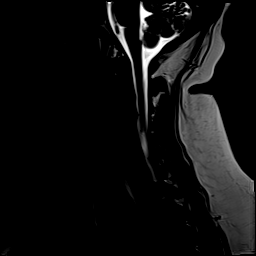
[im 12/12]
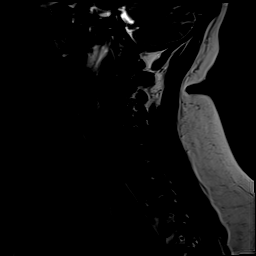

[Series 24: T1 fat-sat post-contrast · sagittal · 3.0mm · 0.86mm/px · 3 of 12 slices shown]
[im 1/12]
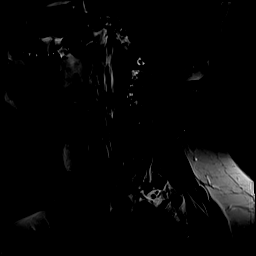
[im 6/12]
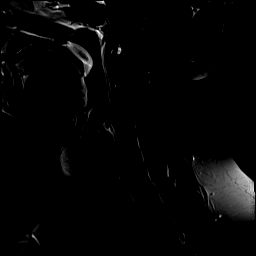
[im 12/12]
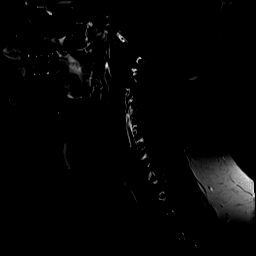

[Series 25: T1 post-contrast · axial · 3.0mm · 0.72mm/px · z∈[-203,-76]mm · 8 of 39 slices shown]
[im 1/39]
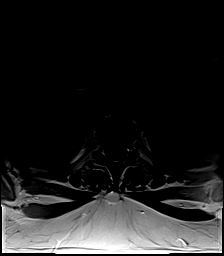
[im 5/39]
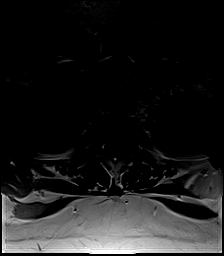
[im 13/39]
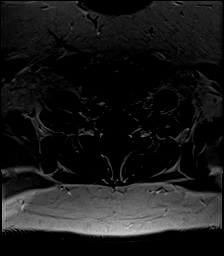
[im 17/39]
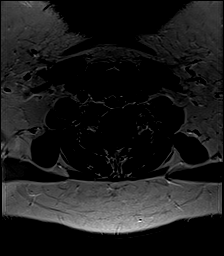
[im 22/39]
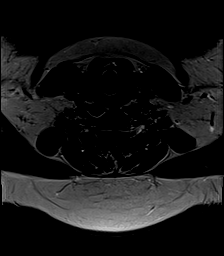
[im 26/39]
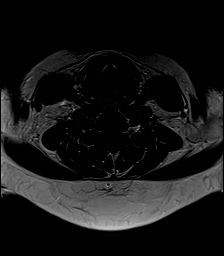
[im 34/39]
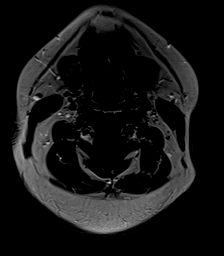
[im 39/39]
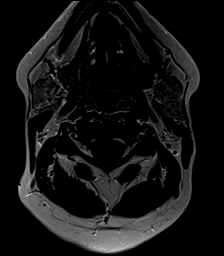

[41 of 48 positions shown; findings below may reference images not displayed]

FINDINGS: Alignment: Straightening of the normal cervical lordosis. No
significant listhesis.

Vertebrae: Vertebral body height maintained without acute or chronic
fracture. Bone marrow signal intensity diffusely decreased on T1
weighted sequence, nonspecific, but most commonly related to anemia,
smoking or obesity. Benign hemangiomata noted within the C6 and T3
vertebral bodies. No worrisome osseous lesions or abnormal marrow
edema.

Cord: Multifocal patchy signal abnormality involving the cervical
spinal cord extending from C2 through C7 again seen. In comparison
with previous exam, overall appearance is relatively stable. No
definite new lesions to suggest disease progression. No abnormal
enhancement to suggest active demyelination.

Posterior Fossa, vertebral arteries, paraspinal tissues:
Craniocervical junction within normal limits. Paraspinous soft
tissues normal. Normal flow voids seen within the vertebral arteries
bilaterally.

Disc levels:

C2-C3: Shallow central disc protrusion minimally indents the ventral
thecal sac. No significant spinal stenosis or cord deformity.
Foramina remain patent.

C3-C4: Mild disc bulge with uncovertebral hypertrophy. No
significant canal or foraminal stenosis.

C4-C5: Minimal left eccentric disc bulge. No canal or foraminal
stenosis.

C5-C6: Shallow right paracentral disc protrusion mildly indents the
right ventral thecal sac. No significant spinal stenosis or cord
deformity. Foramina remain patent.

C6-C7: Mild left-sided uncovertebral hypertrophy without significant
disc bulge. No significant spinal stenosis. Remain patent. Foramina

C7-T1:  Unremarkable.
IMPRESSION: 1. Multifocal patchy signal abnormality involving the cervical
spinal cord, not significantly changed from prior, and consistent
with history of multiple sclerosis. No new lesions to suggest
interval disease progression. No evidence for active demyelination.
2. Underlying mild multilevel cervical spondylosis without
significant stenosis or neural impingement.

## 2021-05-14 IMAGING — MR MR HEAD WO/W CM
15 series · 48 of 48 positions shown · IV contrast (multihance)
Comparison: Prior MRI from [DATE].

CLINICAL DATA: Follow-up examination for multiple sclerosis, recent
neuropathy in bilateral feet.

EXAM:
MRI HEAD WITHOUT AND WITH CONTRAST
TECHNIQUE: Multiplanar, multiecho pulse sequences of the brain and surrounding
structures were obtained without and with intravenous contrast.
CONTRAST:  20mL MULTIHANCE GADOBENATE DIMEGLUMINE 529 MG/ML IV SOLN

[Series 5: T1 · sagittal · 4.0mm · 0.75mm/px · 2 of 31 slices shown (1 of 2)]
[im 1/31]
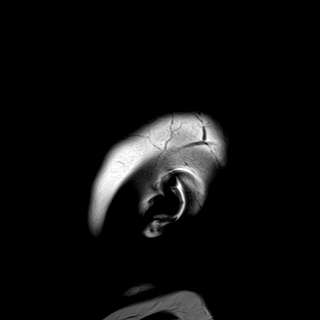
[im 31/31]
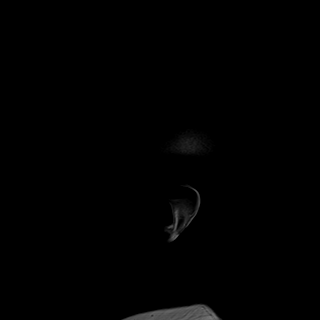

[Series 6: DWI · axial · 3.0mm · 0.94mm/px · z∈[-30,+110]mm · 8 of 160 slices shown (1 of 3)]
[im 1/160]
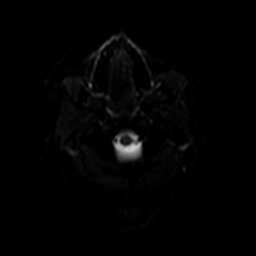
[im 23/160]
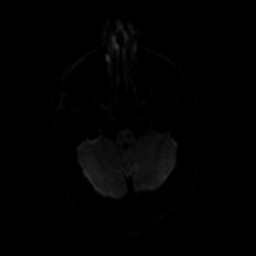
[im 46/160]
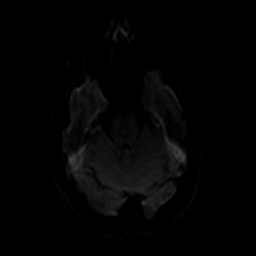
[im 69/160]
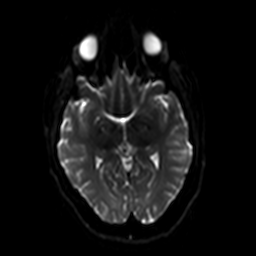
[im 91/160]
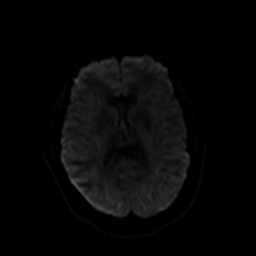
[im 114/160]
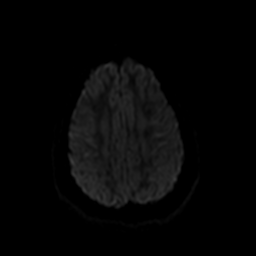
[im 137/160]
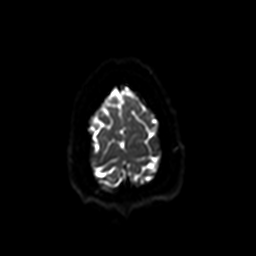
[im 160/160]
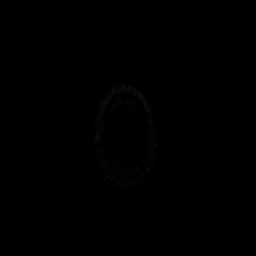

[Series 7: ax dwi_tracew · axial · 3.0mm · 0.94mm/px · z∈[-30,+110]mm · 4 of 80 slices shown]
[im 1/80]
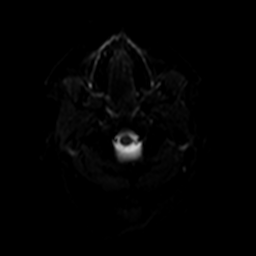
[im 27/80]
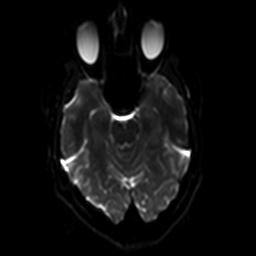
[im 53/80]
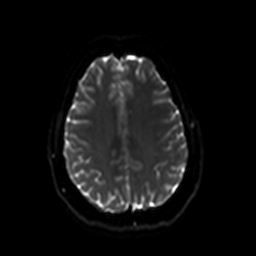
[im 80/80]
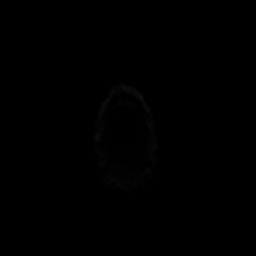

[Series 8: ax dwi_adc · axial · 3.0mm · 0.94mm/px · z∈[-30,+110]mm · 2 of 40 slices shown]
[im 1/40]
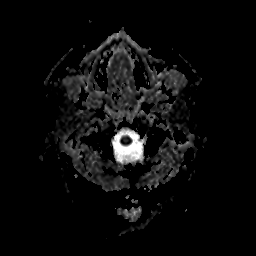
[im 40/40]
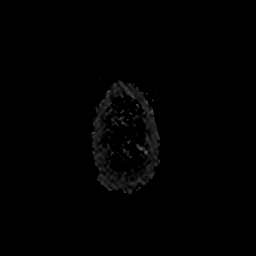

[Series 9: DWI · coronal · 5.0mm · 1.44mm/px · 3 of 60 slices shown (2 of 3)]
[im 1/60]
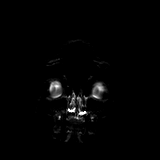
[im 30/60]
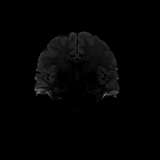
[im 60/60]
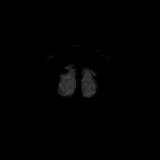

[Series 10: DWI · coronal · 5.0mm · 1.44mm/px · 1 of 30 slices shown (3 of 3)]
[im 1/30]
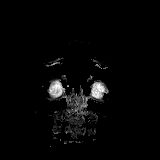

[Series 11: T2 · axial · 4.0mm · 0.36mm/px · 1 of 29 slices shown]
[im 1/29]
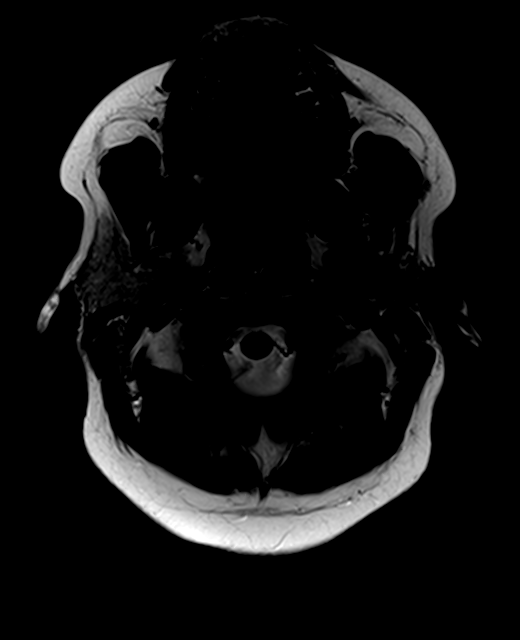

[Series 12: FLAIR · axial · 3.0mm · 0.72mm/px · 1 of 26 slices shown (1 of 2)]
[im 1/26]
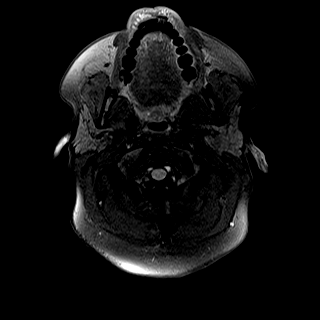

[Series 14: swi_images · axial · 3.0mm · 0.90mm/px · z∈[-61,+127]mm · 3 of 64 slices shown]
[im 1/64]
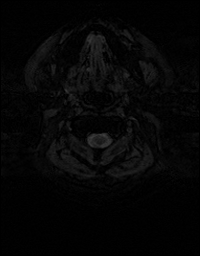
[im 32/64]
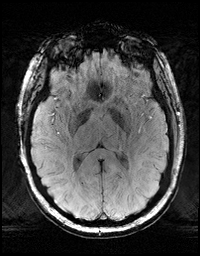
[im 64/64]
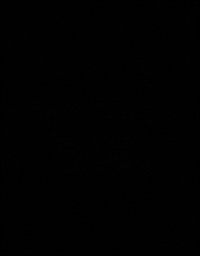

[Series 15: T1 · axial · 1.0mm · 0.90mm/px · z∈[-46,+110]mm · 8 of 158 slices shown (2 of 2)]
[im 1/158]
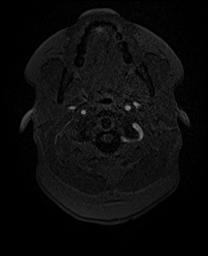
[im 23/158]
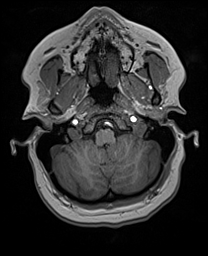
[im 45/158]
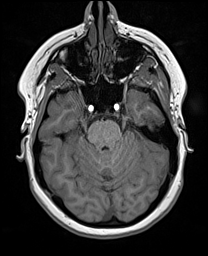
[im 68/158]
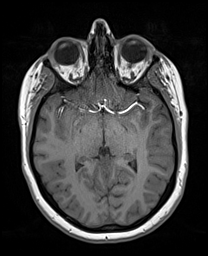
[im 90/158]
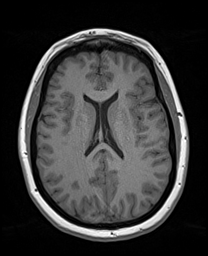
[im 113/158]
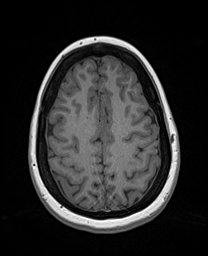
[im 135/158]
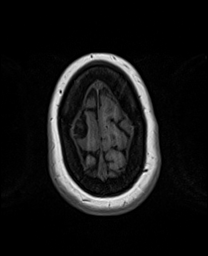
[im 158/158]
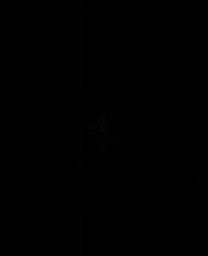

[Series 16: FLAIR · sagittal · 4.0mm · 0.72mm/px · 1 of 27 slices shown (2 of 2)]
[im 1/27]
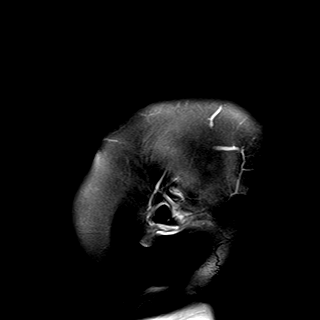

[Series 25: T1 post-contrast · axial · 3.0mm · 0.72mm/px · z∈[-203,-76]mm · 2 of 39 slices shown (1 of 3)]
[im 1/39]
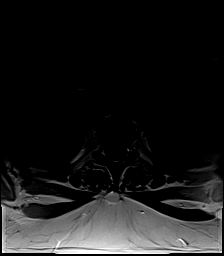
[im 39/39]
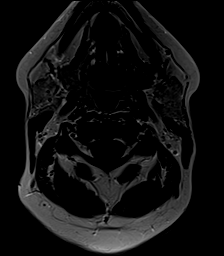

[Series 26: T2 post-contrast · coronal · 4.5mm · 0.36mm/px · 2 of 35 slices shown]
[im 1/35]
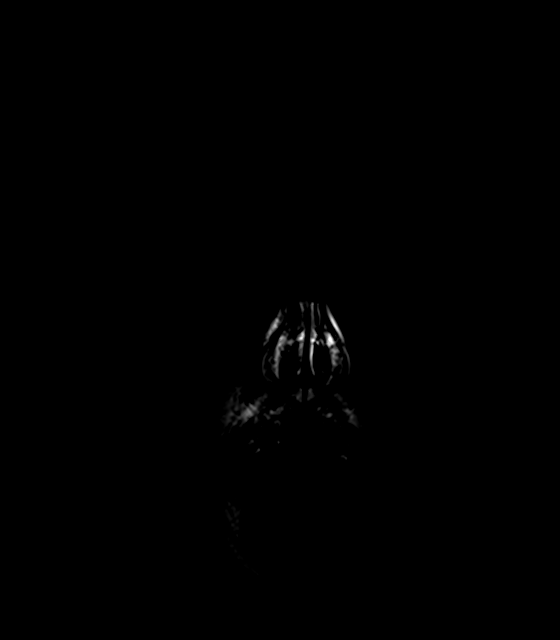
[im 35/35]
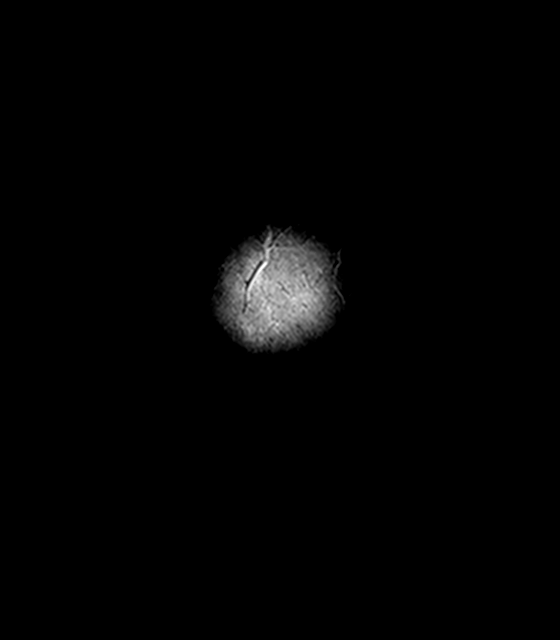

[Series 28: T1 post-contrast · coronal · 4.5mm · 0.90mm/px · 2 of 35 slices shown (2 of 3)]
[im 1/35]
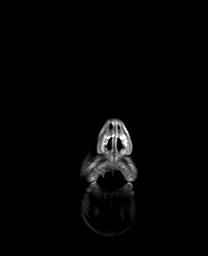
[im 35/35]
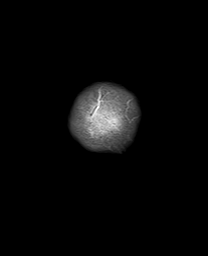

[Series 29: T1 post-contrast · axial · 1.0mm · 0.94mm/px · z∈[-43,+116]mm · 8 of 160 slices shown (3 of 3)]
[im 1/160]
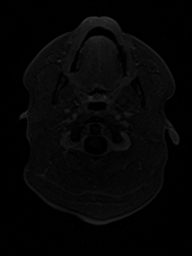
[im 23/160]
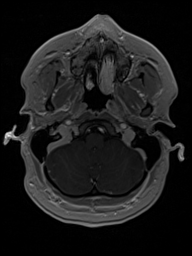
[im 46/160]
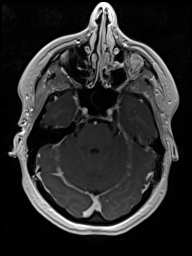
[im 69/160]
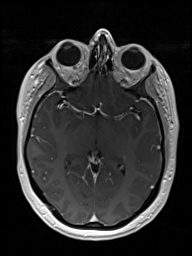
[im 91/160]
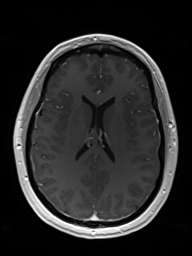
[im 114/160]
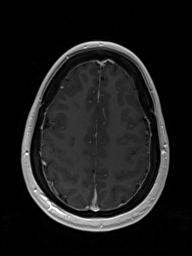
[im 137/160]
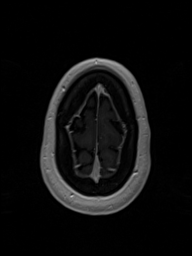
[im 160/160]
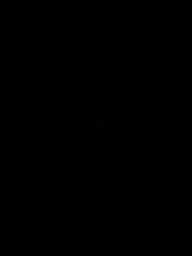

[48 of 48 positions shown; findings below may reference images not displayed]

FINDINGS: Brain: Cerebral volume stable, and remains within normal limits for
age. No evidence for acute or subacute infarct. Gray-white matter
differentiation maintained. No encephalomalacia to suggest chronic
cortical infarction or other insult. No acute or chronic
intracranial blood products. No mass lesion, midline shift or mass
effect. No hydrocephalus or extra-axial fluid collection. Pituitary
gland and suprasellar region normal.

Again seen are a few scattered foci of T2/FLAIR hyperintensity
involving the periventricular and subcortical white matter both
cerebral hemispheres, overall nonspecific, but presumably related to
provided history of multiple sclerosis. Overall appearance is mild
in nature, and not significantly changed from prior. No definite new
lesions to suggest interval disease progression. No abnormal
enhancement to suggest active demyelination.

Vascular: Major intracranial vascular flow voids are maintained.

Skull and upper cervical spine: Craniocervical junction within
normal limits. Bone marrow signal intensity diffusely decreased on
T1 weighted sequence, nonspecific, but most commonly related to
anemia, smoking or obesity. No focal marrow replacing lesion. No
scalp soft tissue abnormality.

Sinuses/Orbits: Globes and orbital soft tissues within normal
limits. Paranasal sinuses are largely clear. No significant mastoid
effusion. Inner ear structures grossly normal.

Other: None.
IMPRESSION: 1. Few scattered T2/FLAIR hyperintensities involving the
supratentorial cerebral white matter, nonspecific, but presumably
related to history of multiple sclerosis. Overall appearance is
stable from prior without evidence for disease progression. No
evidence for active demyelination.
2. Otherwise stable and negative brain MRI. No other acute
intracranial abnormality.

## 2021-05-14 MED ORDER — GADOBENATE DIMEGLUMINE 529 MG/ML IV SOLN
20.0000 mL | Freq: Once | INTRAVENOUS | Status: AC | PRN
  Administered 2021-05-14: 20 mL via INTRAVENOUS

## 2021-05-22 ENCOUNTER — Other Ambulatory Visit: Payer: Self-pay | Admitting: Student

## 2021-05-22 DIAGNOSIS — G35 Multiple sclerosis: Secondary | ICD-10-CM

## 2021-05-22 DIAGNOSIS — Z79899 Other long term (current) drug therapy: Secondary | ICD-10-CM

## 2021-05-22 DIAGNOSIS — G35A Relapsing-remitting multiple sclerosis: Secondary | ICD-10-CM

## 2021-06-13 ENCOUNTER — Ambulatory Visit
Admission: RE | Admit: 2021-06-13 | Discharge: 2021-06-13 | Disposition: A | Discharge status: Home / Self Care | Payer: BC Managed Care – PPO | Source: Ambulatory Visit | Attending: Student | Admitting: Student

## 2021-06-13 ENCOUNTER — Other Ambulatory Visit: Payer: Self-pay

## 2021-06-13 DIAGNOSIS — G35 Multiple sclerosis: Secondary | ICD-10-CM

## 2021-06-13 DIAGNOSIS — Z79899 Other long term (current) drug therapy: Secondary | ICD-10-CM

## 2021-06-13 IMAGING — MR MR LUMBAR SPINE W/O CM
4 of 5 series · 18 of 48 positions shown · non-contrast
Comparison: None.

CLINICAL DATA: Neuropathy in both feet, right leg weakness

EXAM:
MRI LUMBAR SPINE WITHOUT CONTRAST
TECHNIQUE: Multiplanar, multisequence MR imaging of the lumbar spine was
performed. No intravenous contrast was administered.

[Series 6: T2 · sagittal · 4.0mm · 0.73mm/px · 6 of 14 slices shown (1 of 2)]
[im 1/14]
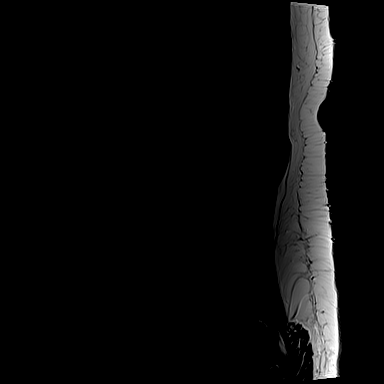
[im 3/14]
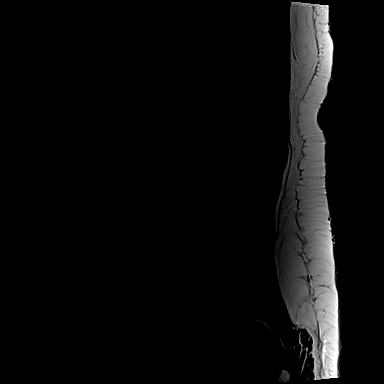
[im 6/14]
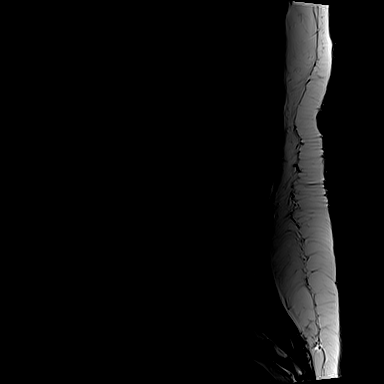
[im 8/14]
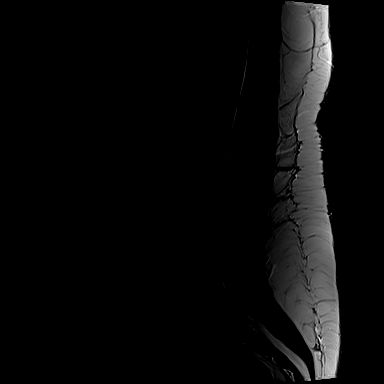
[im 11/14]
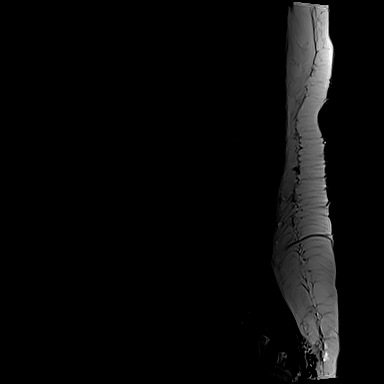
[im 14/14]
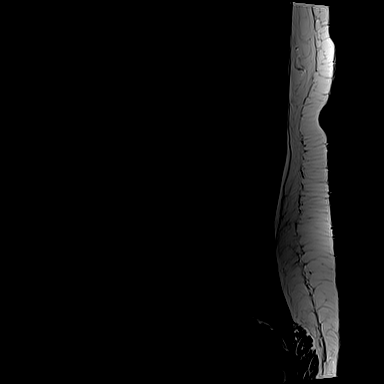

[Series 7: T1 · sagittal · 4.0mm · 0.73mm/px · 3 of 14 slices shown (1 of 2)]
[im 1/14]
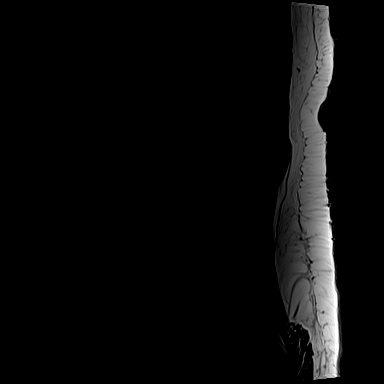
[im 7/14]
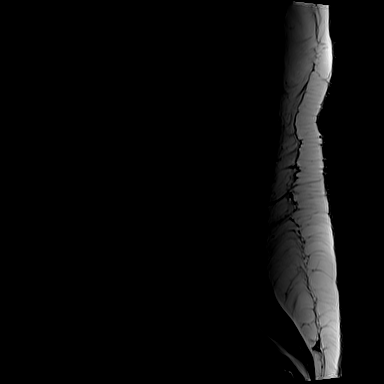
[im 14/14]
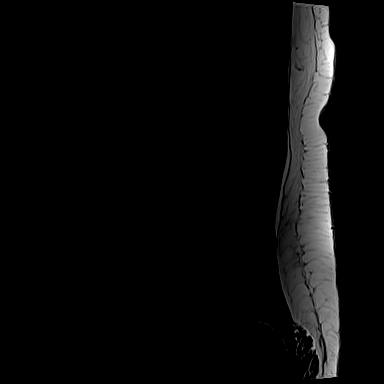

[Series 11: T1 · axial · 4.0mm · 0.28mm/px · z∈[-98,+65]mm · 3 of 41 slices shown (2 of 2)]
[im 6/41]
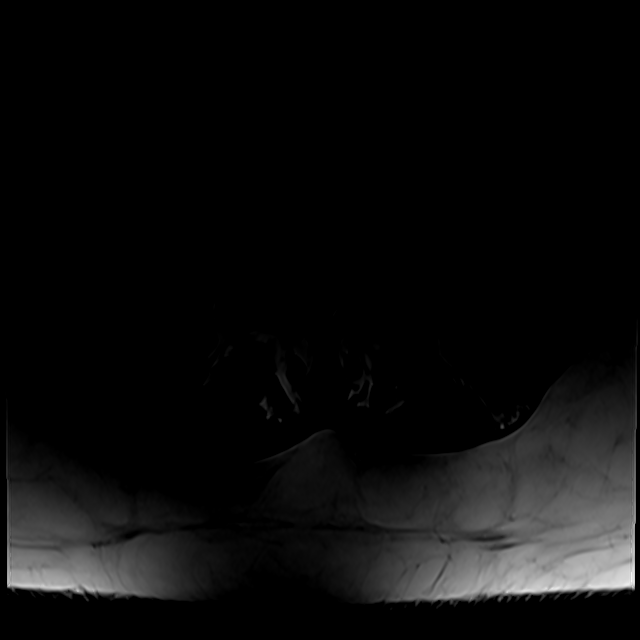
[im 22/41]
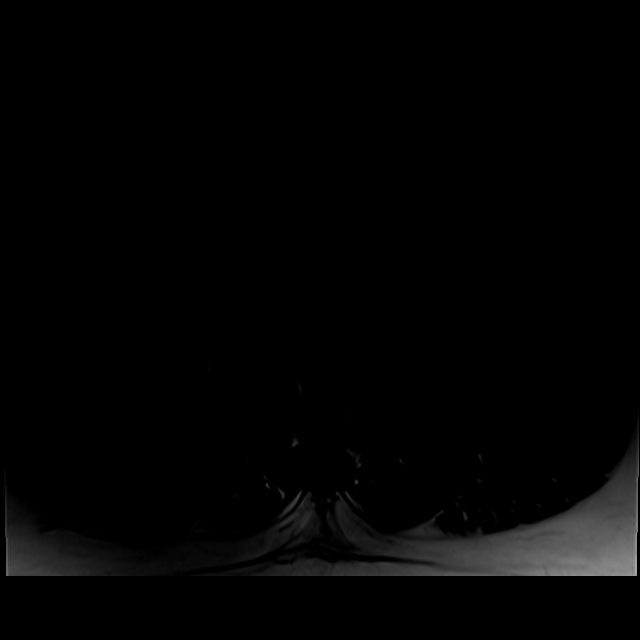
[im 35/41]
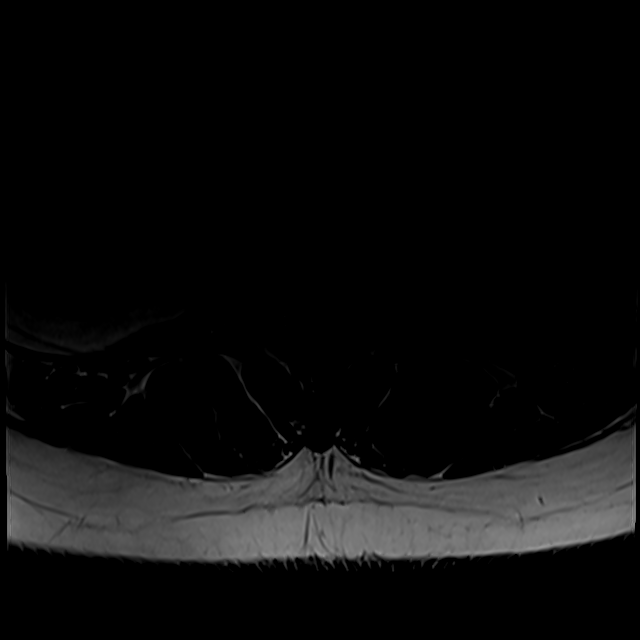

[Series 14: T2 · axial · 4.0mm · 0.28mm/px · z∈[-113,+65]mm · 6 of 41 slices shown (2 of 2)]
[im 3/41]
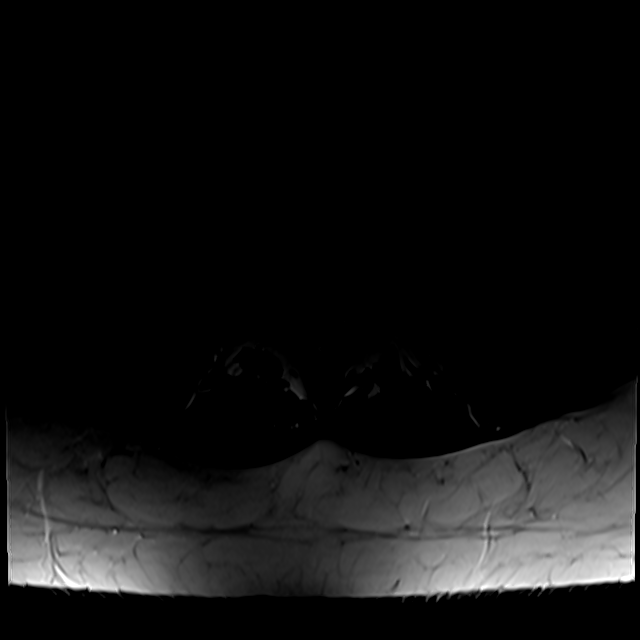
[im 6/41]
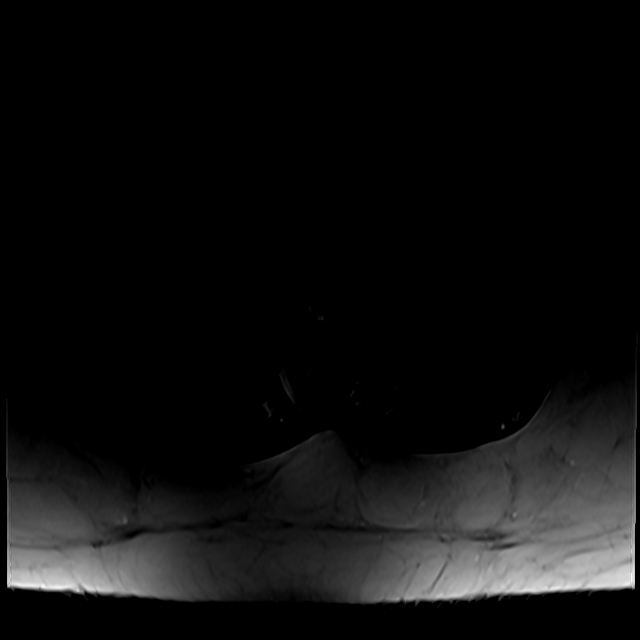
[im 9/41]
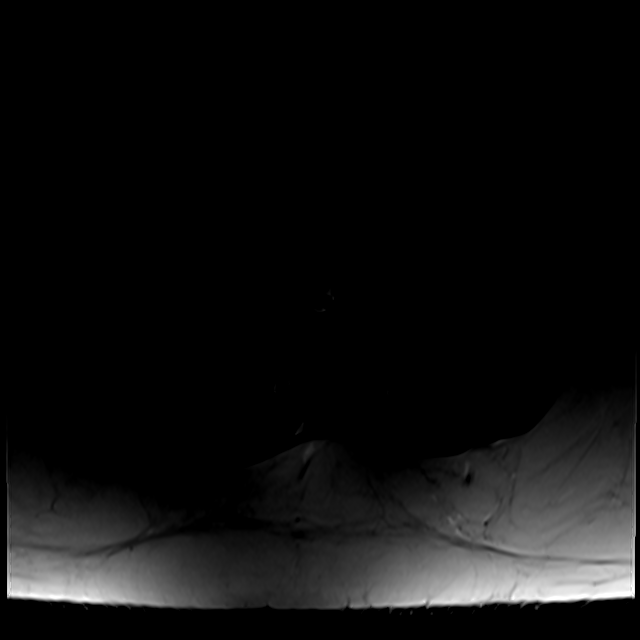
[im 14/41]
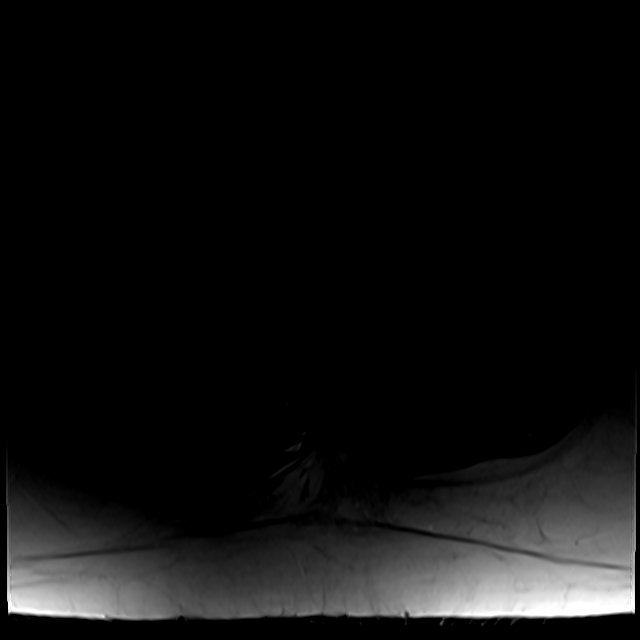
[im 22/41]
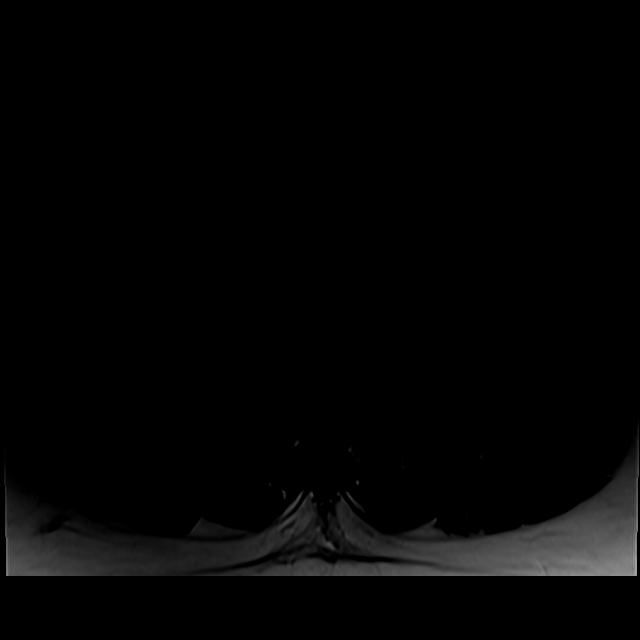
[im 35/41]
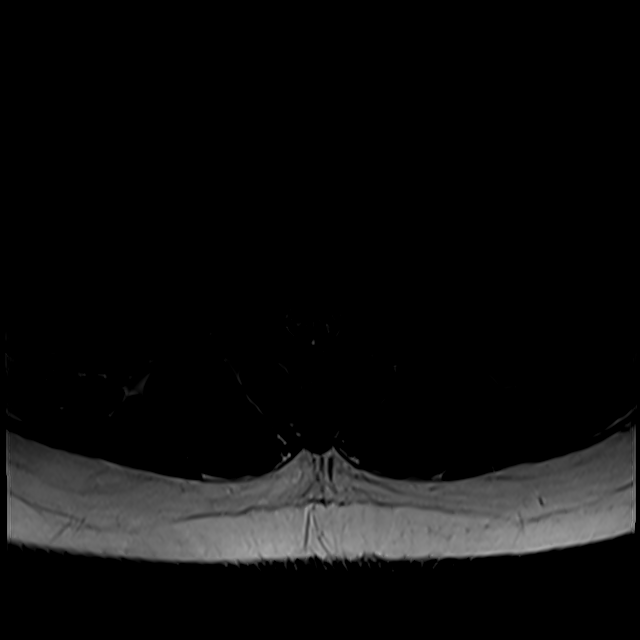

[18 of 48 positions shown; findings below may reference images not displayed]

FINDINGS: Segmentation:  Standard.

Alignment:  Physiologic.

Vertebrae: No fracture, evidence of discitis, or aggressive bone
lesion.

Conus medullaris and cauda equina: Conus extends to the L1 level.
Conus and cauda equina appear normal.

Paraspinal and other soft tissues: Negative

Disc levels:

T11-T12: Slight focal kyphosis with minimal disc bulging. No
significant spinal canal or neural foraminal narrowing.

T12-L1: No significant spinal canal or neural foraminal narrowing.

L1-L2: No significant spinal canal or neural foraminal narrowing.

L2-L3: No significant spinal canal or neural foraminal narrowing.

L3-L4: No significant spinal canal or neural foraminal narrowing.

L4-L5: Disc desiccation with mild disc bulging. No significant
spinal canal stenosis. Mild left-sided neural foraminal narrowing.
No impingement.

L5-S1: No significant spinal canal or neural foraminal narrowing.
IMPRESSION: Mild degenerative disc disease at L4-L5 with mild disc bulging
resulting in mild left-sided neural foraminal narrowing.

No evidence of spinal canal stenosis or nerve impingement.

## 2021-06-19 ENCOUNTER — Inpatient Hospital Stay: Admission: RE | Admit: 2021-06-19 | Payer: BC Managed Care – PPO | Source: Ambulatory Visit

## 2021-06-28 ENCOUNTER — Other Ambulatory Visit: Payer: Self-pay | Admitting: Student

## 2021-06-28 DIAGNOSIS — G35 Multiple sclerosis: Secondary | ICD-10-CM

## 2021-07-05 ENCOUNTER — Ambulatory Visit: Payer: BC Managed Care – PPO | Attending: Neurology | Admitting: Physical Therapy

## 2021-07-05 ENCOUNTER — Ambulatory Visit
Admission: RE | Admit: 2021-07-05 | Discharge: 2021-07-05 | Disposition: A | Discharge status: Home / Self Care | Payer: BC Managed Care – PPO | Source: Ambulatory Visit | Attending: Student | Admitting: Student

## 2021-07-05 DIAGNOSIS — M6281 Muscle weakness (generalized): Secondary | ICD-10-CM | POA: Insufficient documentation

## 2021-07-05 DIAGNOSIS — G35 Multiple sclerosis: Secondary | ICD-10-CM | POA: Diagnosis not present

## 2021-07-06 ENCOUNTER — Encounter: Payer: Self-pay | Admitting: Physical Therapy

## 2021-07-06 NOTE — Therapy (Addendum)
[place] ?[facility] ?[address] [place], [place], [address] ?Phone: [phone]   Fax:  [phone] ? ?Physical Therapy Evaluation ? ?Patient Details  ?Name: Kelly David ?MRN: [id number] ?Date of Birth: Oct 01, 1990 ? ? ?Encounter Date: 07/05/2021 ? ? PT End of Session - 07/06/21 1500   ? ? Visit Number 1   ? Number of Visits 1   ? Date for PT Re-Evaluation 07/06/21   ? PT Start Time [phone]   ? PT Stop Time 0911   ? PT Time Calculation (min) 97 min   ? ?  ?  ? ?  ? ? ?Past Medical History:  ?Diagnosis Date  ? Anemia   ? Depression   ? Multiple sclerosis (HCC)   ? ? ?Past Surgical History:  ?Procedure Laterality Date  ? LAPAROSCOPIC ENDOMETRIOSIS FULGURATION N/A   ? WRIST SURGERY Bilateral   ? ? ?There were no vitals filed for this visit. ? ? ? Subjective Assessment - 07/07/21 2007   ? ? Subjective See FCE report   ? Patient Stated Goals FCE only (1x visit)   ? Currently in Pain? Yes   ? Pain Score 1    ? Pain Location Hip   ? Pain Orientation Right   ? ?  ?  ? ?  ? ? ? ?FCE report scanned into MyChart and faxed to MD office.   ? ?Patient will have difficulty returning to work without restrictions.  See task performance section.  ? ? ? ? ? Plan - 07/07/21 2010   ? ? Clinical Impression Statement Overall Level of Work: Falls within the Medium range.  Exerting 20 to 50 pounds of force occasionally, and/or 10 to 25 pounds of force frequently, and/or greater than negligible up to 10 pounds of force constantly to move objects.  Physical Demand requirements are in excess of those for Light Work.    Please see the Task Performance Table for specific abilities.  Tolerance for the 8-Hour Day: Based on the individual task scores in Dynamic Strength, Position Tolerance and Mobility, the client is able to tolerate the Medium level of work for the 8-hour day/40-hour week.   ? Stability/Clinical Decision Making Stable/Uncomplicated   ? Clinical Decision Making Low   ? Rehab Potential Good    ? PT Frequency One time visit   ? PT Treatment/Interventions Balance training;Therapeutic exercise;Therapeutic activities;Functional mobility training   ? PT Next Visit Plan FCE only  (report faxed to MD office).   ? ?  ?  ? ?  ? ? ?Patient will benefit from skilled therapeutic intervention in order to improve the following deficits and impairments:  Decreased endurance, Decreased activity tolerance, Pain, Decreased strength ? ?Visit Diagnosis: ?Multiple sclerosis (HCC) ? ?Muscle weakness (generalized) ? ? ? ? ?Problem List ?Patient Active Problem List  ? Diagnosis Date Noted  ? NSVD (normal spontaneous vaginal delivery) 03/25/2021  ? Upper extremity weakness 04/17/2020  ? Hypokalemia 04/17/2020  ? Drug-induced hyperglycemia 04/17/2020  ? 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 Depression 08/10/2018  ? Multiple sclerosis (HCC) 08/10/2018  ? Moderate episode of recurrent major depressive disorder (HCC) 04/28/2013  ? ?Cammie Mcgee, PT, DPT # [phone] ?07/07/2021, 8:14 PM ? ?[place] ?[facility] ?[address] [place], [place], [address] ?Phone: [phone]   Fax:  [phone] ? ?Name: Kelly David ?MRN: [id number] ?Date of Birth: 1990/08/14 ? ? ?

## 2021-07-07 NOTE — Addendum Note (Signed)
Addended by: Cammie Mcgee on: 07/07/2021 08:24 PM ? ? Modules accepted: Orders ? ?

## 2021-09-17 ENCOUNTER — Telehealth (HOSPITAL_COMMUNITY): Payer: Self-pay

## 2021-09-17 NOTE — Telephone Encounter (Signed)
Pt called to follow up on a referral sent by her provider. I did not see any active referrals. Asked patient to have provider resend with records.
